# Patient Record
Sex: Male | Born: 1950 | Race: Black or African American | Hispanic: No | Marital: Married | Smoking: Former smoker
Health system: Southern US, Community
[De-identification: ages and names within clinical notes are randomized; demographics above are authoritative.]

## PROBLEM LIST (undated history)

## (undated) DIAGNOSIS — E785 Hyperlipidemia, unspecified: Secondary | ICD-10-CM

## (undated) DIAGNOSIS — E119 Type 2 diabetes mellitus without complications: Secondary | ICD-10-CM

## (undated) DIAGNOSIS — M542 Cervicalgia: Secondary | ICD-10-CM

## (undated) DIAGNOSIS — G44209 Tension-type headache, unspecified, not intractable: Secondary | ICD-10-CM

## (undated) DIAGNOSIS — N189 Chronic kidney disease, unspecified: Secondary | ICD-10-CM

## (undated) DIAGNOSIS — B009 Herpesviral infection, unspecified: Secondary | ICD-10-CM

## (undated) HISTORY — DX: Hyperlipidemia, unspecified: E78.5

## (undated) HISTORY — PX: OTHER SURGICAL HISTORY: SHX169

## (undated) HISTORY — DX: Cervicalgia: M54.2

## (undated) HISTORY — DX: Chronic kidney disease, unspecified: N18.9

## (undated) HISTORY — DX: Type 2 diabetes mellitus without complications: E11.9

## (undated) HISTORY — DX: Herpesviral infection, unspecified: B00.9

## (undated) HISTORY — DX: Tension-type headache, unspecified, not intractable: G44.209

---
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
## 2016-02-08 DIAGNOSIS — L908 Other atrophic disorders of skin: Secondary | ICD-10-CM | POA: Diagnosis not present

## 2016-02-08 DIAGNOSIS — B078 Other viral warts: Secondary | ICD-10-CM | POA: Diagnosis not present

## 2016-02-08 DIAGNOSIS — M150 Primary generalized (osteo)arthritis: Secondary | ICD-10-CM | POA: Diagnosis not present

## 2016-02-09 DIAGNOSIS — N529 Male erectile dysfunction, unspecified: Secondary | ICD-10-CM | POA: Diagnosis not present

## 2016-02-10 DIAGNOSIS — R51 Headache: Secondary | ICD-10-CM | POA: Diagnosis not present

## 2016-02-10 DIAGNOSIS — M542 Cervicalgia: Secondary | ICD-10-CM | POA: Diagnosis not present

## 2016-02-12 DIAGNOSIS — H04123 Dry eye syndrome of bilateral lacrimal glands: Secondary | ICD-10-CM | POA: Diagnosis not present

## 2016-02-12 DIAGNOSIS — E113293 Type 2 diabetes mellitus with mild nonproliferative diabetic retinopathy without macular edema, bilateral: Secondary | ICD-10-CM | POA: Diagnosis not present

## 2016-02-16 DIAGNOSIS — L309 Dermatitis, unspecified: Secondary | ICD-10-CM | POA: Diagnosis not present

## 2016-02-20 DIAGNOSIS — B353 Tinea pedis: Secondary | ICD-10-CM | POA: Diagnosis not present

## 2016-02-20 DIAGNOSIS — B078 Other viral warts: Secondary | ICD-10-CM | POA: Diagnosis not present

## 2016-02-20 DIAGNOSIS — M150 Primary generalized (osteo)arthritis: Secondary | ICD-10-CM | POA: Diagnosis not present

## 2016-03-28 DIAGNOSIS — J309 Allergic rhinitis, unspecified: Secondary | ICD-10-CM | POA: Diagnosis not present

## 2016-03-28 DIAGNOSIS — E119 Type 2 diabetes mellitus without complications: Secondary | ICD-10-CM | POA: Diagnosis not present

## 2016-03-30 DIAGNOSIS — B351 Tinea unguium: Secondary | ICD-10-CM | POA: Diagnosis not present

## 2016-03-30 DIAGNOSIS — M2041 Other hammer toe(s) (acquired), right foot: Secondary | ICD-10-CM | POA: Diagnosis not present

## 2016-03-30 DIAGNOSIS — M79675 Pain in left toe(s): Secondary | ICD-10-CM | POA: Diagnosis not present

## 2016-03-30 DIAGNOSIS — L908 Other atrophic disorders of skin: Secondary | ICD-10-CM | POA: Diagnosis not present

## 2016-03-30 DIAGNOSIS — M79674 Pain in right toe(s): Secondary | ICD-10-CM | POA: Diagnosis not present

## 2016-03-30 DIAGNOSIS — M2042 Other hammer toe(s) (acquired), left foot: Secondary | ICD-10-CM | POA: Diagnosis not present

## 2016-04-18 DIAGNOSIS — Z800 Family history of malignant neoplasm of digestive organs: Secondary | ICD-10-CM | POA: Diagnosis not present

## 2016-04-18 DIAGNOSIS — R634 Abnormal weight loss: Secondary | ICD-10-CM | POA: Diagnosis not present

## 2016-04-18 DIAGNOSIS — K30 Functional dyspepsia: Secondary | ICD-10-CM | POA: Diagnosis not present

## 2016-04-18 DIAGNOSIS — Z1211 Encounter for screening for malignant neoplasm of colon: Secondary | ICD-10-CM | POA: Diagnosis not present

## 2016-04-27 DIAGNOSIS — B078 Other viral warts: Secondary | ICD-10-CM | POA: Diagnosis not present

## 2016-04-27 DIAGNOSIS — L908 Other atrophic disorders of skin: Secondary | ICD-10-CM | POA: Diagnosis not present

## 2016-04-27 DIAGNOSIS — M2042 Other hammer toe(s) (acquired), left foot: Secondary | ICD-10-CM | POA: Diagnosis not present

## 2016-04-27 DIAGNOSIS — M2041 Other hammer toe(s) (acquired), right foot: Secondary | ICD-10-CM | POA: Diagnosis not present

## 2016-04-27 DIAGNOSIS — M150 Primary generalized (osteo)arthritis: Secondary | ICD-10-CM | POA: Diagnosis not present

## 2016-05-03 DIAGNOSIS — K2950 Unspecified chronic gastritis without bleeding: Secondary | ICD-10-CM | POA: Diagnosis not present

## 2016-05-03 DIAGNOSIS — D126 Benign neoplasm of colon, unspecified: Secondary | ICD-10-CM | POA: Diagnosis not present

## 2016-05-03 DIAGNOSIS — K2270 Barrett's esophagus without dysplasia: Secondary | ICD-10-CM | POA: Diagnosis not present

## 2016-05-03 DIAGNOSIS — Z1211 Encounter for screening for malignant neoplasm of colon: Secondary | ICD-10-CM | POA: Diagnosis not present

## 2016-05-03 DIAGNOSIS — K2900 Acute gastritis without bleeding: Secondary | ICD-10-CM | POA: Diagnosis not present

## 2016-05-03 DIAGNOSIS — R634 Abnormal weight loss: Secondary | ICD-10-CM | POA: Diagnosis not present

## 2016-05-03 DIAGNOSIS — K529 Noninfective gastroenteritis and colitis, unspecified: Secondary | ICD-10-CM | POA: Diagnosis not present

## 2016-05-09 DIAGNOSIS — K760 Fatty (change of) liver, not elsewhere classified: Secondary | ICD-10-CM | POA: Diagnosis not present

## 2016-05-09 DIAGNOSIS — I700 Atherosclerosis of aorta: Secondary | ICD-10-CM | POA: Diagnosis not present

## 2016-05-09 DIAGNOSIS — K2950 Unspecified chronic gastritis without bleeding: Secondary | ICD-10-CM | POA: Diagnosis not present

## 2016-05-09 DIAGNOSIS — R1032 Left lower quadrant pain: Secondary | ICD-10-CM | POA: Diagnosis not present

## 2016-05-09 DIAGNOSIS — D126 Benign neoplasm of colon, unspecified: Secondary | ICD-10-CM | POA: Diagnosis not present

## 2016-05-09 DIAGNOSIS — K529 Noninfective gastroenteritis and colitis, unspecified: Secondary | ICD-10-CM | POA: Diagnosis not present

## 2016-05-09 DIAGNOSIS — K2270 Barrett's esophagus without dysplasia: Secondary | ICD-10-CM | POA: Diagnosis not present

## 2016-05-09 DIAGNOSIS — N401 Enlarged prostate with lower urinary tract symptoms: Secondary | ICD-10-CM | POA: Diagnosis not present

## 2016-05-10 DIAGNOSIS — E119 Type 2 diabetes mellitus without complications: Secondary | ICD-10-CM | POA: Diagnosis not present

## 2016-05-10 DIAGNOSIS — T508X5A Adverse effect of diagnostic agents, initial encounter: Secondary | ICD-10-CM | POA: Diagnosis not present

## 2016-05-10 DIAGNOSIS — L299 Pruritus, unspecified: Secondary | ICD-10-CM | POA: Diagnosis not present

## 2016-05-10 DIAGNOSIS — T7840XA Allergy, unspecified, initial encounter: Secondary | ICD-10-CM | POA: Diagnosis not present

## 2016-05-10 DIAGNOSIS — R220 Localized swelling, mass and lump, head: Secondary | ICD-10-CM | POA: Diagnosis not present

## 2016-05-10 DIAGNOSIS — E039 Hypothyroidism, unspecified: Secondary | ICD-10-CM | POA: Diagnosis not present

## 2016-05-10 DIAGNOSIS — Z791 Long term (current) use of non-steroidal anti-inflammatories (NSAID): Secondary | ICD-10-CM | POA: Diagnosis not present

## 2016-05-13 DIAGNOSIS — R05 Cough: Secondary | ICD-10-CM | POA: Diagnosis not present

## 2016-05-23 DIAGNOSIS — M79674 Pain in right toe(s): Secondary | ICD-10-CM | POA: Diagnosis not present

## 2016-05-23 DIAGNOSIS — M79675 Pain in left toe(s): Secondary | ICD-10-CM | POA: Diagnosis not present

## 2016-05-23 DIAGNOSIS — B078 Other viral warts: Secondary | ICD-10-CM | POA: Diagnosis not present

## 2016-05-23 DIAGNOSIS — L908 Other atrophic disorders of skin: Secondary | ICD-10-CM | POA: Diagnosis not present

## 2016-05-23 DIAGNOSIS — B353 Tinea pedis: Secondary | ICD-10-CM | POA: Diagnosis not present

## 2016-06-06 DIAGNOSIS — K2900 Acute gastritis without bleeding: Secondary | ICD-10-CM | POA: Diagnosis not present

## 2016-06-09 DIAGNOSIS — K130 Diseases of lips: Secondary | ICD-10-CM | POA: Diagnosis not present

## 2016-06-09 DIAGNOSIS — J309 Allergic rhinitis, unspecified: Secondary | ICD-10-CM | POA: Diagnosis not present

## 2016-06-09 DIAGNOSIS — R630 Anorexia: Secondary | ICD-10-CM | POA: Diagnosis not present

## 2016-06-09 DIAGNOSIS — E119 Type 2 diabetes mellitus without complications: Secondary | ICD-10-CM | POA: Diagnosis not present

## 2016-06-09 DIAGNOSIS — N401 Enlarged prostate with lower urinary tract symptoms: Secondary | ICD-10-CM | POA: Diagnosis not present

## 2016-06-09 DIAGNOSIS — E559 Vitamin D deficiency, unspecified: Secondary | ICD-10-CM | POA: Diagnosis not present

## 2016-06-09 DIAGNOSIS — B9681 Helicobacter pylori [H. pylori] as the cause of diseases classified elsewhere: Secondary | ICD-10-CM | POA: Diagnosis not present

## 2016-06-09 DIAGNOSIS — E782 Mixed hyperlipidemia: Secondary | ICD-10-CM | POA: Diagnosis not present

## 2016-06-09 DIAGNOSIS — E039 Hypothyroidism, unspecified: Secondary | ICD-10-CM | POA: Diagnosis not present

## 2016-06-20 DIAGNOSIS — M71572 Other bursitis, not elsewhere classified, left ankle and foot: Secondary | ICD-10-CM | POA: Diagnosis not present

## 2016-06-20 DIAGNOSIS — M2041 Other hammer toe(s) (acquired), right foot: Secondary | ICD-10-CM | POA: Diagnosis not present

## 2016-06-20 DIAGNOSIS — B078 Other viral warts: Secondary | ICD-10-CM | POA: Diagnosis not present

## 2016-06-28 DIAGNOSIS — B9681 Helicobacter pylori [H. pylori] as the cause of diseases classified elsewhere: Secondary | ICD-10-CM | POA: Diagnosis not present

## 2016-06-28 DIAGNOSIS — K2900 Acute gastritis without bleeding: Secondary | ICD-10-CM | POA: Diagnosis not present

## 2016-07-18 DIAGNOSIS — M79674 Pain in right toe(s): Secondary | ICD-10-CM | POA: Diagnosis not present

## 2016-07-18 DIAGNOSIS — M71572 Other bursitis, not elsewhere classified, left ankle and foot: Secondary | ICD-10-CM | POA: Diagnosis not present

## 2016-07-18 DIAGNOSIS — M79675 Pain in left toe(s): Secondary | ICD-10-CM | POA: Diagnosis not present

## 2016-07-18 DIAGNOSIS — M2041 Other hammer toe(s) (acquired), right foot: Secondary | ICD-10-CM | POA: Diagnosis not present

## 2016-07-18 DIAGNOSIS — B351 Tinea unguium: Secondary | ICD-10-CM | POA: Diagnosis not present

## 2016-07-18 DIAGNOSIS — M2042 Other hammer toe(s) (acquired), left foot: Secondary | ICD-10-CM | POA: Diagnosis not present

## 2016-08-17 DIAGNOSIS — M71572 Other bursitis, not elsewhere classified, left ankle and foot: Secondary | ICD-10-CM | POA: Diagnosis not present

## 2016-08-17 DIAGNOSIS — L908 Other atrophic disorders of skin: Secondary | ICD-10-CM | POA: Diagnosis not present

## 2016-08-17 DIAGNOSIS — B078 Other viral warts: Secondary | ICD-10-CM | POA: Diagnosis not present

## 2016-09-05 DIAGNOSIS — R0600 Dyspnea, unspecified: Secondary | ICD-10-CM | POA: Diagnosis not present

## 2016-09-05 DIAGNOSIS — E782 Mixed hyperlipidemia: Secondary | ICD-10-CM | POA: Diagnosis not present

## 2016-09-05 DIAGNOSIS — R972 Elevated prostate specific antigen [PSA]: Secondary | ICD-10-CM | POA: Diagnosis not present

## 2016-09-05 DIAGNOSIS — Z0000 Encounter for general adult medical examination without abnormal findings: Secondary | ICD-10-CM | POA: Diagnosis not present

## 2016-09-05 DIAGNOSIS — M79606 Pain in leg, unspecified: Secondary | ICD-10-CM | POA: Diagnosis not present

## 2016-09-05 DIAGNOSIS — E119 Type 2 diabetes mellitus without complications: Secondary | ICD-10-CM | POA: Diagnosis not present

## 2016-09-05 DIAGNOSIS — L608 Other nail disorders: Secondary | ICD-10-CM | POA: Diagnosis not present

## 2016-09-05 DIAGNOSIS — H9319 Tinnitus, unspecified ear: Secondary | ICD-10-CM | POA: Diagnosis not present

## 2016-09-05 DIAGNOSIS — R630 Anorexia: Secondary | ICD-10-CM | POA: Diagnosis not present

## 2016-09-05 DIAGNOSIS — E039 Hypothyroidism, unspecified: Secondary | ICD-10-CM | POA: Diagnosis not present

## 2016-09-05 DIAGNOSIS — Z1389 Encounter for screening for other disorder: Secondary | ICD-10-CM | POA: Diagnosis not present

## 2016-09-05 DIAGNOSIS — B9681 Helicobacter pylori [H. pylori] as the cause of diseases classified elsewhere: Secondary | ICD-10-CM | POA: Diagnosis not present

## 2016-09-05 DIAGNOSIS — E559 Vitamin D deficiency, unspecified: Secondary | ICD-10-CM | POA: Diagnosis not present

## 2016-09-05 DIAGNOSIS — J309 Allergic rhinitis, unspecified: Secondary | ICD-10-CM | POA: Diagnosis not present

## 2016-09-05 DIAGNOSIS — I519 Heart disease, unspecified: Secondary | ICD-10-CM | POA: Diagnosis not present

## 2016-09-05 DIAGNOSIS — R7309 Other abnormal glucose: Secondary | ICD-10-CM | POA: Diagnosis not present

## 2016-09-09 DIAGNOSIS — I349 Nonrheumatic mitral valve disorder, unspecified: Secondary | ICD-10-CM | POA: Diagnosis not present

## 2016-09-09 DIAGNOSIS — R0989 Other specified symptoms and signs involving the circulatory and respiratory systems: Secondary | ICD-10-CM | POA: Diagnosis not present

## 2016-09-09 DIAGNOSIS — I700 Atherosclerosis of aorta: Secondary | ICD-10-CM | POA: Diagnosis not present

## 2016-09-19 DIAGNOSIS — J019 Acute sinusitis, unspecified: Secondary | ICD-10-CM | POA: Diagnosis not present

## 2016-12-19 DIAGNOSIS — M150 Primary generalized (osteo)arthritis: Secondary | ICD-10-CM | POA: Diagnosis not present

## 2016-12-19 DIAGNOSIS — L908 Other atrophic disorders of skin: Secondary | ICD-10-CM | POA: Diagnosis not present

## 2016-12-19 DIAGNOSIS — B078 Other viral warts: Secondary | ICD-10-CM | POA: Diagnosis not present

## 2016-12-19 DIAGNOSIS — B353 Tinea pedis: Secondary | ICD-10-CM | POA: Diagnosis not present

## 2017-01-16 DIAGNOSIS — M79675 Pain in left toe(s): Secondary | ICD-10-CM | POA: Diagnosis not present

## 2017-01-16 DIAGNOSIS — B353 Tinea pedis: Secondary | ICD-10-CM | POA: Diagnosis not present

## 2017-01-16 DIAGNOSIS — B351 Tinea unguium: Secondary | ICD-10-CM | POA: Diagnosis not present

## 2017-01-16 DIAGNOSIS — M79674 Pain in right toe(s): Secondary | ICD-10-CM | POA: Diagnosis not present

## 2017-01-16 DIAGNOSIS — M150 Primary generalized (osteo)arthritis: Secondary | ICD-10-CM | POA: Diagnosis not present

## 2017-02-18 DIAGNOSIS — B078 Other viral warts: Secondary | ICD-10-CM | POA: Diagnosis not present

## 2017-02-18 DIAGNOSIS — M2011 Hallux valgus (acquired), right foot: Secondary | ICD-10-CM | POA: Diagnosis not present

## 2017-02-18 DIAGNOSIS — M2012 Hallux valgus (acquired), left foot: Secondary | ICD-10-CM | POA: Diagnosis not present

## 2017-02-18 DIAGNOSIS — L908 Other atrophic disorders of skin: Secondary | ICD-10-CM | POA: Diagnosis not present

## 2017-03-15 DIAGNOSIS — B009 Herpesviral infection, unspecified: Secondary | ICD-10-CM | POA: Insufficient documentation

## 2017-03-15 DIAGNOSIS — E119 Type 2 diabetes mellitus without complications: Secondary | ICD-10-CM | POA: Diagnosis not present

## 2017-03-15 DIAGNOSIS — R05 Cough: Secondary | ICD-10-CM | POA: Diagnosis not present

## 2017-03-15 DIAGNOSIS — R070 Pain in throat: Secondary | ICD-10-CM | POA: Diagnosis not present

## 2017-03-15 DIAGNOSIS — R0981 Nasal congestion: Secondary | ICD-10-CM | POA: Diagnosis not present

## 2017-03-15 DIAGNOSIS — R52 Pain, unspecified: Secondary | ICD-10-CM | POA: Diagnosis not present

## 2017-03-29 DIAGNOSIS — M150 Primary generalized (osteo)arthritis: Secondary | ICD-10-CM | POA: Diagnosis not present

## 2017-03-29 DIAGNOSIS — M79674 Pain in right toe(s): Secondary | ICD-10-CM | POA: Diagnosis not present

## 2017-03-29 DIAGNOSIS — M79675 Pain in left toe(s): Secondary | ICD-10-CM | POA: Diagnosis not present

## 2017-03-29 DIAGNOSIS — B351 Tinea unguium: Secondary | ICD-10-CM | POA: Diagnosis not present

## 2017-03-29 DIAGNOSIS — L908 Other atrophic disorders of skin: Secondary | ICD-10-CM | POA: Diagnosis not present

## 2017-04-06 DIAGNOSIS — I519 Heart disease, unspecified: Secondary | ICD-10-CM | POA: Diagnosis not present

## 2017-04-06 DIAGNOSIS — M25462 Effusion, left knee: Secondary | ICD-10-CM | POA: Diagnosis not present

## 2017-04-06 DIAGNOSIS — E559 Vitamin D deficiency, unspecified: Secondary | ICD-10-CM | POA: Diagnosis not present

## 2017-04-06 DIAGNOSIS — E119 Type 2 diabetes mellitus without complications: Secondary | ICD-10-CM | POA: Diagnosis not present

## 2017-04-06 DIAGNOSIS — E782 Mixed hyperlipidemia: Secondary | ICD-10-CM | POA: Diagnosis not present

## 2017-04-06 DIAGNOSIS — E039 Hypothyroidism, unspecified: Secondary | ICD-10-CM | POA: Diagnosis not present

## 2017-04-06 DIAGNOSIS — R6889 Other general symptoms and signs: Secondary | ICD-10-CM | POA: Diagnosis not present

## 2017-04-06 DIAGNOSIS — J309 Allergic rhinitis, unspecified: Secondary | ICD-10-CM | POA: Diagnosis not present

## 2017-04-06 DIAGNOSIS — I10 Essential (primary) hypertension: Secondary | ICD-10-CM | POA: Diagnosis not present

## 2017-04-06 DIAGNOSIS — M79606 Pain in leg, unspecified: Secondary | ICD-10-CM | POA: Diagnosis not present

## 2017-04-06 DIAGNOSIS — R630 Anorexia: Secondary | ICD-10-CM | POA: Diagnosis not present

## 2017-04-12 DIAGNOSIS — M1711 Unilateral primary osteoarthritis, right knee: Secondary | ICD-10-CM | POA: Diagnosis not present

## 2017-04-17 DIAGNOSIS — H905 Unspecified sensorineural hearing loss: Secondary | ICD-10-CM | POA: Diagnosis not present

## 2017-04-17 DIAGNOSIS — H902 Conductive hearing loss, unspecified: Secondary | ICD-10-CM | POA: Diagnosis not present

## 2017-04-17 DIAGNOSIS — H6123 Impacted cerumen, bilateral: Secondary | ICD-10-CM | POA: Diagnosis not present

## 2017-05-01 DIAGNOSIS — M2042 Other hammer toe(s) (acquired), left foot: Secondary | ICD-10-CM | POA: Diagnosis not present

## 2017-05-01 DIAGNOSIS — M150 Primary generalized (osteo)arthritis: Secondary | ICD-10-CM | POA: Diagnosis not present

## 2017-05-01 DIAGNOSIS — M2041 Other hammer toe(s) (acquired), right foot: Secondary | ICD-10-CM | POA: Diagnosis not present

## 2017-05-01 DIAGNOSIS — L908 Other atrophic disorders of skin: Secondary | ICD-10-CM | POA: Diagnosis not present

## 2017-06-05 DIAGNOSIS — M79674 Pain in right toe(s): Secondary | ICD-10-CM | POA: Diagnosis not present

## 2017-06-05 DIAGNOSIS — M79675 Pain in left toe(s): Secondary | ICD-10-CM | POA: Diagnosis not present

## 2017-06-05 DIAGNOSIS — M2042 Other hammer toe(s) (acquired), left foot: Secondary | ICD-10-CM | POA: Diagnosis not present

## 2017-06-05 DIAGNOSIS — M2041 Other hammer toe(s) (acquired), right foot: Secondary | ICD-10-CM | POA: Diagnosis not present

## 2017-06-05 DIAGNOSIS — M71572 Other bursitis, not elsewhere classified, left ankle and foot: Secondary | ICD-10-CM | POA: Diagnosis not present

## 2017-06-05 DIAGNOSIS — B351 Tinea unguium: Secondary | ICD-10-CM | POA: Diagnosis not present

## 2017-07-01 DIAGNOSIS — Z8673 Personal history of transient ischemic attack (TIA), and cerebral infarction without residual deficits: Secondary | ICD-10-CM

## 2017-07-01 HISTORY — DX: Personal history of transient ischemic attack (TIA), and cerebral infarction without residual deficits: Z86.73

## 2017-07-03 DIAGNOSIS — M150 Primary generalized (osteo)arthritis: Secondary | ICD-10-CM | POA: Diagnosis not present

## 2017-07-03 DIAGNOSIS — L748 Other eccrine sweat disorders: Secondary | ICD-10-CM | POA: Diagnosis not present

## 2017-07-03 DIAGNOSIS — L908 Other atrophic disorders of skin: Secondary | ICD-10-CM | POA: Diagnosis not present

## 2017-07-17 DIAGNOSIS — I519 Heart disease, unspecified: Secondary | ICD-10-CM | POA: Diagnosis not present

## 2017-07-17 DIAGNOSIS — J309 Allergic rhinitis, unspecified: Secondary | ICD-10-CM | POA: Diagnosis not present

## 2017-07-17 DIAGNOSIS — E119 Type 2 diabetes mellitus without complications: Secondary | ICD-10-CM | POA: Diagnosis not present

## 2017-07-17 DIAGNOSIS — E782 Mixed hyperlipidemia: Secondary | ICD-10-CM | POA: Diagnosis not present

## 2017-07-17 DIAGNOSIS — E039 Hypothyroidism, unspecified: Secondary | ICD-10-CM | POA: Diagnosis not present

## 2017-07-17 DIAGNOSIS — R7309 Other abnormal glucose: Secondary | ICD-10-CM | POA: Diagnosis not present

## 2017-07-17 DIAGNOSIS — R6889 Other general symptoms and signs: Secondary | ICD-10-CM | POA: Diagnosis not present

## 2017-08-14 DIAGNOSIS — B351 Tinea unguium: Secondary | ICD-10-CM | POA: Diagnosis not present

## 2017-08-14 DIAGNOSIS — M79675 Pain in left toe(s): Secondary | ICD-10-CM | POA: Diagnosis not present

## 2017-08-14 DIAGNOSIS — M79674 Pain in right toe(s): Secondary | ICD-10-CM | POA: Diagnosis not present

## 2017-08-14 DIAGNOSIS — M150 Primary generalized (osteo)arthritis: Secondary | ICD-10-CM | POA: Diagnosis not present

## 2017-08-14 DIAGNOSIS — L908 Other atrophic disorders of skin: Secondary | ICD-10-CM | POA: Diagnosis not present

## 2017-09-18 DIAGNOSIS — L748 Other eccrine sweat disorders: Secondary | ICD-10-CM | POA: Diagnosis not present

## 2017-09-18 DIAGNOSIS — L908 Other atrophic disorders of skin: Secondary | ICD-10-CM | POA: Diagnosis not present

## 2017-09-18 DIAGNOSIS — M150 Primary generalized (osteo)arthritis: Secondary | ICD-10-CM | POA: Diagnosis not present

## 2017-10-16 DIAGNOSIS — M71572 Other bursitis, not elsewhere classified, left ankle and foot: Secondary | ICD-10-CM | POA: Diagnosis not present

## 2017-10-16 DIAGNOSIS — M2041 Other hammer toe(s) (acquired), right foot: Secondary | ICD-10-CM | POA: Diagnosis not present

## 2017-10-16 DIAGNOSIS — L749 Eccrine sweat disorder, unspecified: Secondary | ICD-10-CM | POA: Diagnosis not present

## 2017-10-16 DIAGNOSIS — M2042 Other hammer toe(s) (acquired), left foot: Secondary | ICD-10-CM | POA: Diagnosis not present

## 2017-10-25 DIAGNOSIS — E782 Mixed hyperlipidemia: Secondary | ICD-10-CM | POA: Diagnosis not present

## 2017-10-25 DIAGNOSIS — Z23 Encounter for immunization: Secondary | ICD-10-CM | POA: Diagnosis not present

## 2017-10-25 DIAGNOSIS — M25462 Effusion, left knee: Secondary | ICD-10-CM | POA: Diagnosis not present

## 2017-10-25 DIAGNOSIS — E1165 Type 2 diabetes mellitus with hyperglycemia: Secondary | ICD-10-CM | POA: Diagnosis not present

## 2017-10-25 DIAGNOSIS — R6889 Other general symptoms and signs: Secondary | ICD-10-CM | POA: Diagnosis not present

## 2017-10-25 DIAGNOSIS — E039 Hypothyroidism, unspecified: Secondary | ICD-10-CM | POA: Diagnosis not present

## 2017-10-25 DIAGNOSIS — I10 Essential (primary) hypertension: Secondary | ICD-10-CM | POA: Diagnosis not present

## 2017-10-25 DIAGNOSIS — I519 Heart disease, unspecified: Secondary | ICD-10-CM | POA: Diagnosis not present

## 2017-10-25 DIAGNOSIS — E559 Vitamin D deficiency, unspecified: Secondary | ICD-10-CM | POA: Diagnosis not present

## 2017-10-25 DIAGNOSIS — J309 Allergic rhinitis, unspecified: Secondary | ICD-10-CM | POA: Diagnosis not present

## 2017-10-25 DIAGNOSIS — Z0000 Encounter for general adult medical examination without abnormal findings: Secondary | ICD-10-CM | POA: Diagnosis not present

## 2017-10-25 DIAGNOSIS — E119 Type 2 diabetes mellitus without complications: Secondary | ICD-10-CM | POA: Diagnosis not present

## 2017-10-25 DIAGNOSIS — M79606 Pain in leg, unspecified: Secondary | ICD-10-CM | POA: Diagnosis not present

## 2017-11-13 DIAGNOSIS — B351 Tinea unguium: Secondary | ICD-10-CM | POA: Diagnosis not present

## 2017-11-13 DIAGNOSIS — M71572 Other bursitis, not elsewhere classified, left ankle and foot: Secondary | ICD-10-CM | POA: Diagnosis not present

## 2017-11-13 DIAGNOSIS — M79674 Pain in right toe(s): Secondary | ICD-10-CM | POA: Diagnosis not present

## 2017-11-13 DIAGNOSIS — M79675 Pain in left toe(s): Secondary | ICD-10-CM | POA: Diagnosis not present

## 2017-11-13 DIAGNOSIS — M2042 Other hammer toe(s) (acquired), left foot: Secondary | ICD-10-CM | POA: Diagnosis not present

## 2017-11-13 DIAGNOSIS — M2041 Other hammer toe(s) (acquired), right foot: Secondary | ICD-10-CM | POA: Diagnosis not present

## 2017-12-20 DIAGNOSIS — J069 Acute upper respiratory infection, unspecified: Secondary | ICD-10-CM | POA: Diagnosis not present

## 2018-01-30 ENCOUNTER — Encounter: Payer: Self-pay | Admitting: Podiatry

## 2018-01-30 ENCOUNTER — Ambulatory Visit (INDEPENDENT_AMBULATORY_CARE_PROVIDER_SITE_OTHER): Payer: Medicare Other | Admitting: Podiatry

## 2018-01-30 VITALS — BP 140/78

## 2018-01-30 DIAGNOSIS — M2042 Other hammer toe(s) (acquired), left foot: Secondary | ICD-10-CM

## 2018-01-30 DIAGNOSIS — B009 Herpesviral infection, unspecified: Secondary | ICD-10-CM | POA: Insufficient documentation

## 2018-01-30 DIAGNOSIS — M2011 Hallux valgus (acquired), right foot: Secondary | ICD-10-CM | POA: Diagnosis not present

## 2018-01-30 DIAGNOSIS — M2012 Hallux valgus (acquired), left foot: Secondary | ICD-10-CM

## 2018-01-30 DIAGNOSIS — L84 Corns and callosities: Secondary | ICD-10-CM | POA: Diagnosis not present

## 2018-01-30 DIAGNOSIS — M2041 Other hammer toe(s) (acquired), right foot: Secondary | ICD-10-CM

## 2018-01-30 DIAGNOSIS — E119 Type 2 diabetes mellitus without complications: Secondary | ICD-10-CM | POA: Insufficient documentation

## 2018-01-30 NOTE — Patient Instructions (Signed)
Diabetes Mellitus and Foot Care  Foot care is an important part of your health, especially when you have diabetes. Diabetes may cause you to have problems because of poor blood flow (circulation) to your feet and legs, which can cause your skin to:   Become thinner and drier.   Break more easily.   Heal more slowly.   Peel and crack.  You may also have nerve damage (neuropathy) in your legs and feet, causing decreased feeling in them. This means that you may not notice minor injuries to your feet that could lead to more serious problems. Noticing and addressing any potential problems early is the best way to prevent future foot problems.  How to care for your feet  Foot hygiene   Wash your feet daily with warm water and mild soap. Do not use hot water. Then, pat your feet and the areas between your toes until they are completely dry. Do not soak your feet as this can dry your skin.   Trim your toenails straight across. Do not dig under them or around the cuticle. File the edges of your nails with an emery board or nail file.   Apply a moisturizing lotion or petroleum jelly to the skin on your feet and to dry, brittle toenails. Use lotion that does not contain alcohol and is unscented. Do not apply lotion between your toes.  Shoes and socks   Wear clean socks or stockings every day. Make sure they are not too tight. Do not wear knee-high stockings since they may decrease blood flow to your legs.   Wear shoes that fit properly and have enough cushioning. Always look in your shoes before you put them on to be sure there are no objects inside.   To break in new shoes, wear them for just a few hours a day. This prevents injuries on your feet.  Wounds, scrapes, corns, and calluses   Check your feet daily for blisters, cuts, bruises, sores, and redness. If you cannot see the bottom of your feet, use a mirror or ask someone for help.   Do not cut corns or calluses or try to remove them with medicine.   If you  find a minor scrape, cut, or break in the skin on your feet, keep it and the skin around it clean and dry. You may clean these areas with mild soap and water. Do not clean the area with peroxide, alcohol, or iodine.   If you have a wound, scrape, corn, or callus on your foot, look at it several times a day to make sure it is healing and not infected. Check for:  ? Redness, swelling, or pain.  ? Fluid or blood.  ? Warmth.  ? Pus or a bad smell.  General instructions   Do not cross your legs. This may decrease blood flow to your feet.   Do not use heating pads or hot water bottles on your feet. They may burn your skin. If you have lost feeling in your feet or legs, you may not know this is happening until it is too late.   Protect your feet from hot and cold by wearing shoes, such as at the beach or on hot pavement.   Schedule a complete foot exam at least once a year (annually) or more often if you have foot problems. If you have foot problems, report any cuts, sores, or bruises to your health care provider immediately.  Contact a health care provider if:     You have a medical condition that increases your risk of infection and you have any cuts, sores, or bruises on your feet.   You have an injury that is not healing.   You have redness on your legs or feet.   You feel burning or tingling in your legs or feet.   You have pain or cramps in your legs and feet.   Your legs or feet are numb.   Your feet always feel cold.   You have pain around a toenail.  Get help right away if:   You have a wound, scrape, corn, or callus on your foot and:  ? You have pain, swelling, or redness that gets worse.  ? You have fluid or blood coming from the wound, scrape, corn, or callus.  ? Your wound, scrape, corn, or callus feels warm to the touch.  ? You have pus or a bad smell coming from the wound, scrape, corn, or callus.  ? You have a fever.  ? You have a red line going up your leg.  Summary   Check your feet every day  for cuts, sores, red spots, swelling, and blisters.   Moisturize feet and legs daily.   Wear shoes that fit properly and have enough cushioning.   If you have foot problems, report any cuts, sores, or bruises to your health care provider immediately.   Schedule a complete foot exam at least once a year (annually) or more often if you have foot problems.  This information is not intended to replace advice given to you by your health care provider. Make sure you discuss any questions you have with your health care provider.  Document Released: 01/15/2000 Document Revised: 03/01/2017 Document Reviewed: 02/19/2016  Elsevier Interactive Patient Education  2019 [employer] Inc[employer]

## 2018-02-25 NOTE — Progress Notes (Signed)
Subjective: Richard Shaw presents to clinic today to establish diabetic foot care.  He relocated to [place] from [place] with his family.  He has not established care with the primary care provider as of today.    Patient is seen today with chief complaint of calluses on the plantar aspect of both feet.  He states he has had podiatric care in the past in [place] in [address].    No past medical history on file.  Patient Active Problem List   Diagnosis Date Noted  . DM (diabetes mellitus) ([facility]) 01/30/2018  . Herpes 01/30/2018      Current Outpatient Medications:  .  famciclovir (FAMVIR) 125 MG tablet, Take 125 mg by mouth 2 (two) times daily., Disp: , Rfl:  .  glipiZIDE (GLUCOTROL) 10 MG tablet, Take 10 mg by mouth daily before breakfast., Disp: , Rfl:  .  metFORMIN (GLUCOPHAGE) 500 MG tablet, Take 100 mg by mouth 2 (two) times daily with a meal. , Disp: , Rfl:   Allergies  Allergen Reactions  . Penicillins Other (See Comments)    hallucinations    Social History   Occupational History  . Not on file  Tobacco Use  . Smoking status: Former Research scientist (life sciences)  . Smokeless tobacco: Former Network engineer and Sexual Activity  . Alcohol use: Not on file  . Drug use: Not on file  . Sexual activity: Not on file    No family history on file.   There is no immunization history on file for this patient.   Review of systems: Positive Findings in bold print.  Constitutional:  chills, fatigue, fever, sweats, weight change Communication: Optometrist, sign Ecologist, hand writing, iPad/Android device Head: headaches, head injury Eyes: changes in vision, eye pain, glaucoma, cataracts, macular degeneration, diplopia, glare,  light sensitivity, eyeglasses or contacts, blindness Ears nose mouth throat: Hard of hearing, ringing in ears, deaf, sign language,  vertigo,   nosebleeds,  rhinitis,  cold sores, snoring, swollen glands Cardiovascular: HTN, edema, arrhythmia, pacemaker  in place, defibrillator in place,  chest pain/tightness, chronic anticoagulation, blood clot, heart failure Peripheral Vascular: leg cramps, varicose veins, blood clots, lymphedema Respiratory:  difficulty breathing, denies congestion, SOB, wheezing, cough, emphysema Gastrointestinal: change in appetite or weight, abdominal pain, constipation, diarrhea, nausea, vomiting, vomiting blood, change in bowel habits, abdominal pain, jaundice, rectal bleeding, hemorrhoids, Genitourinary:  nocturia,  pain on urination,  blood in urine, Foley catheter, urinary urgency Musculoskeletal: uses mobility aid,  cramping, stiff joints, painful joints, decreased joint motion, fractures, OA, gout Skin: +changes in toenails, color change, dryness, itching, mole changes,  rash  Neurological: headaches, numbness in feet, paresthesias in feet, burning in feet, fainting,  seizures, change in speech. denies headaches, memory problems/poor historian, cerebral palsy, weakness, paralysis Endocrine: diabetes, hypothyroidism, hyperthyroidism,  goiter, dry mouth, flushing, heat intolerance,  cold intolerance,  excessive thirst, denies polyuria,  nocturia Hematological:  easy bleeding, excessive bleeding, easy bruising, enlarged lymph nodes, on long term blood thinner, history of past transusions Allergy/immunological:  hives, eczema, frequent infections, multiple drug allergies, seasonal allergies, transplant recipient Psychiatric:  anxiety, depression, mood disorder, suicidal ideations, hallucinations   Objective: Vascular Examination: Capillary refill time immediate x 10 digits Dorsalis pedis and posterior tibial pulses present b/l No digital hair x 10 digits Skin temperature gradient WNL b/l  Dermatological Examination: Skin with normal turgor, texture and tone b/l  Toenails 1-5 b/l are well-maintained with adequate length.  Hyperkeratotic lesions noted submetatarsal head 3 left foot, submetatarsal head  1 right foot,  and submetatarsal head 5 right.  Musculoskeletal: Muscle strength 5/5 to all LE muscle groups  Hammertoes noted digits 2 through 5 bilaterally Rigid hammertoe deformity noted left second digit. Hallux abductovalgus with bunion deformity bilaterally.  Neurological: Sensation intact with 10 gram monofilament Vibratory sensation intact.  Assessment: 1. Painful calluses submetatarsal head 3 left foot submetatarsal head 1 right foot and submetatarsal head 5 right foot 2. NIDDM 3. Hallux abductovalgus with bunion deformity bilaterally 4. Hammertoe deformity digits 2 through 5 bilaterally  Plan: 1. Discussed diabetic foot care principles. Literature dispensed on today. 2. We did discuss the diabetic shoe program with the patient.  However he will have to wait until he establishes care with a primary care physician who will be treating his diabetes. 3. Patient to continue soft, supportive shoe gear 4. Patient to report any pedal injuries to medical professional immediately. 5. Follow up 3 months. Patient/POA to call should there be a concern in the interim.

## 2018-02-28 DIAGNOSIS — E1169 Type 2 diabetes mellitus with other specified complication: Secondary | ICD-10-CM | POA: Diagnosis not present

## 2018-02-28 DIAGNOSIS — B001 Herpesviral vesicular dermatitis: Secondary | ICD-10-CM | POA: Diagnosis not present

## 2018-02-28 DIAGNOSIS — D229 Melanocytic nevi, unspecified: Secondary | ICD-10-CM | POA: Diagnosis not present

## 2018-03-28 DIAGNOSIS — E119 Type 2 diabetes mellitus without complications: Secondary | ICD-10-CM | POA: Diagnosis not present

## 2018-03-28 DIAGNOSIS — Z23 Encounter for immunization: Secondary | ICD-10-CM | POA: Diagnosis not present

## 2018-03-28 DIAGNOSIS — Z125 Encounter for screening for malignant neoplasm of prostate: Secondary | ICD-10-CM | POA: Diagnosis not present

## 2018-03-28 DIAGNOSIS — N189 Chronic kidney disease, unspecified: Secondary | ICD-10-CM | POA: Diagnosis not present

## 2018-03-28 DIAGNOSIS — D229 Melanocytic nevi, unspecified: Secondary | ICD-10-CM | POA: Diagnosis not present

## 2018-03-28 DIAGNOSIS — Z0000 Encounter for general adult medical examination without abnormal findings: Secondary | ICD-10-CM | POA: Diagnosis not present

## 2018-03-28 DIAGNOSIS — Z1389 Encounter for screening for other disorder: Secondary | ICD-10-CM | POA: Diagnosis not present

## 2018-03-28 DIAGNOSIS — B001 Herpesviral vesicular dermatitis: Secondary | ICD-10-CM | POA: Diagnosis not present

## 2018-03-28 DIAGNOSIS — E1169 Type 2 diabetes mellitus with other specified complication: Secondary | ICD-10-CM | POA: Diagnosis not present

## 2018-03-28 DIAGNOSIS — Z1211 Encounter for screening for malignant neoplasm of colon: Secondary | ICD-10-CM | POA: Diagnosis not present

## 2018-04-03 ENCOUNTER — Other Ambulatory Visit: Payer: Self-pay

## 2018-04-03 ENCOUNTER — Ambulatory Visit (INDEPENDENT_AMBULATORY_CARE_PROVIDER_SITE_OTHER): Payer: Medicare Other | Admitting: Podiatry

## 2018-04-03 DIAGNOSIS — M79675 Pain in left toe(s): Secondary | ICD-10-CM | POA: Diagnosis not present

## 2018-04-03 DIAGNOSIS — E119 Type 2 diabetes mellitus without complications: Secondary | ICD-10-CM | POA: Diagnosis not present

## 2018-04-03 DIAGNOSIS — B351 Tinea unguium: Secondary | ICD-10-CM

## 2018-04-03 DIAGNOSIS — M79674 Pain in right toe(s): Secondary | ICD-10-CM | POA: Diagnosis not present

## 2018-04-03 DIAGNOSIS — L84 Corns and callosities: Secondary | ICD-10-CM | POA: Diagnosis not present

## 2018-04-03 NOTE — Patient Instructions (Addendum)
Corns and Calluses Corns are small areas of thickened skin that occur on the top, sides, or tip of a toe. They contain a cone-shaped core with a point that can press on a nerve below. This causes pain.  Calluses are areas of thickened skin that can occur anywhere on the body, including the hands, fingers, palms, soles of the feet, and heels. Calluses are usually larger than corns. What are the causes? Corns and calluses are caused by rubbing (friction) or pressure, such as from shoes that are too tight or do not fit properly. What increases the risk? Corns are more likely to develop in people who have misshapen toes (toe deformities), such as hammer toes. Calluses can occur with friction to any area of the skin. They are more likely to develop in people who:  Work with their hands.  Wear shoes that fit poorly, are too tight, or are high-heeled.  Have toe deformities. What are the signs or symptoms? Symptoms of a corn or callus include:  A hard growth on the skin.  Pain or tenderness under the skin.  Redness and swelling.  Increased discomfort while wearing tight-fitting shoes, if your feet are affected. If a corn or callus becomes infected, symptoms may include:  Redness and swelling that gets worse.  Pain.  Fluid, blood, or pus draining from the corn or callus. How is this diagnosed? Corns and calluses may be diagnosed based on your symptoms, your medical history, and a physical exam. How is this treated? Treatment for corns and calluses may include:  Removing the cause of the friction or pressure. This may involve: ? Changing your shoes. ? Wearing shoe inserts (orthotics) or other protective layers in your shoes, such as a corn pad. ? Wearing gloves.  Applying medicine to the skin (topical medicine) to help soften skin in the hardened, thickened areas.  Removing layers of dead skin with a file to reduce the size of the corn or callus.  Removing the corn or callus with  a scalpel or laser.  Taking antibiotic medicines, if your corn or callus is infected.  Having surgery, if a toe deformity is the cause. Follow these instructions at home:   Take over-the-counter and prescription medicines only as told by your health care provider.  If you were prescribed an antibiotic, take it as told by your health care provider. Do not stop taking it even if your condition starts to improve.  Wear shoes that fit well. Avoid wearing high-heeled shoes and shoes that are too tight or too loose.  Wear any padding, protective layers, gloves, or orthotics as told by your health care provider.  Soak your hands or feet and then use a file or pumice stone to soften your corn or callus. Do this as told by your health care provider.  Check your corn or callus every day for symptoms of infection. Contact a health care provider if you:  Notice that your symptoms do not improve with treatment.  Have redness or swelling that gets worse.  Notice that your corn or callus becomes painful.  Have fluid, blood, or pus coming from your corn or callus.  Have new symptoms. Summary  Corns are small areas of thickened skin that occur on the top, sides, or tip of a toe.  Calluses are areas of thickened skin that can occur anywhere on the body, including the hands, fingers, palms, and soles of the feet. Calluses are usually larger than corns.  Corns and calluses are caused by  rubbing (friction) or pressure, such as from shoes that are too tight or do not fit properly.  Treatment may include wearing any padding, protective layers, gloves, or orthotics as told by your health care provider. This information is not intended to replace advice given to you by your health care provider. Make sure you discuss any questions you have with your health care provider. Document Released: 10/24/2003 Document Revised: 11/30/2016 Document Reviewed: 11/30/2016 Elsevier Interactive Patient Education   2019 [employer] Inc[employer]  Diabetes Mellitus and Foot Care Foot care is an important part of your health, especially when you have diabetes. Diabetes may cause you to have problems because of poor blood flow (circulation) to your feet and legs, which can cause your skin to:  Become thinner and drier.  Break more easily.  Heal more slowly.  Peel and crack. You may also have nerve damage (neuropathy) in your legs and feet, causing decreased feeling in them. This means that you may not notice minor injuries to your feet that could lead to more serious problems. Noticing and addressing any potential problems early is the best way to prevent future foot problems. How to care for your feet Foot hygiene  Wash your feet daily with warm water and mild soap. Do not use hot water. Then, pat your feet and the areas between your toes until they are completely dry. Do not soak your feet as this can dry your skin.  Trim your toenails straight across. Do not dig under them or around the cuticle. File the edges of your nails with an emery board or nail file.  Apply a moisturizing lotion or petroleum jelly to the skin on your feet and to dry, brittle toenails. Use lotion that does not contain alcohol and is unscented. Do not apply lotion between your toes. Shoes and socks  Wear clean socks or stockings every day. Make sure they are not too tight. Do not wear knee-high stockings since they may decrease blood flow to your legs.  Wear shoes that fit properly and have enough cushioning. Always look in your shoes before you put them on to be sure there are no objects inside.  To break in new shoes, wear them for just a few hours a day. This prevents injuries on your feet. Wounds, scrapes, corns, and calluses  Check your feet daily for blisters, cuts, bruises, sores, and redness. If you cannot see the bottom of your feet, use a mirror or ask someone for help.  Do not cut corns or calluses or try to remove them with  medicine.  If you find a minor scrape, cut, or break in the skin on your feet, keep it and the skin around it clean and dry. You may clean these areas with mild soap and water. Do not clean the area with peroxide, alcohol, or iodine.  If you have a wound, scrape, corn, or callus on your foot, look at it several times a day to make sure it is healing and not infected. Check for: ? Redness, swelling, or pain. ? Fluid or blood. ? Warmth. ? Pus or a bad smell. General instructions  Do not cross your legs. This may decrease blood flow to your feet.  Do not use heating pads or hot water bottles on your feet. They may burn your skin. If you have lost feeling in your feet or legs, you may not know this is happening until it is too late.  Protect your feet from hot and cold by wearing   shoes, such as at the beach or on hot pavement.  Schedule a complete foot exam at least once a year (annually) or more often if you have foot problems. If you have foot problems, report any cuts, sores, or bruises to your health care provider immediately. Contact a health care provider if:  You have a medical condition that increases your risk of infection and you have any cuts, sores, or bruises on your feet.  You have an injury that is not healing.  You have redness on your legs or feet.  You feel burning or tingling in your legs or feet.  You have pain or cramps in your legs and feet.  Your legs or feet are numb.  Your feet always feel cold.  You have pain around a toenail. Get help right away if:  You have a wound, scrape, corn, or callus on your foot and: ? You have pain, swelling, or redness that gets worse. ? You have fluid or blood coming from the wound, scrape, corn, or callus. ? Your wound, scrape, corn, or callus feels warm to the touch. ? You have pus or a bad smell coming from the wound, scrape, corn, or callus. ? You have a fever. ? You have a red line going up your leg. Summary  Check  your feet every day for cuts, sores, red spots, swelling, and blisters.  Moisturize feet and legs daily.  Wear shoes that fit properly and have enough cushioning.  If you have foot problems, report any cuts, sores, or bruises to your health care provider immediately.  Schedule a complete foot exam at least once a year (annually) or more often if you have foot problems. This information is not intended to replace advice given to you by your health care provider. Make sure you discuss any questions you have with your health care provider. Document Released: 01/15/2000 Document Revised: 03/01/2017 Document Reviewed: 02/19/2016 Elsevier Interactive Patient Education  2019 [employer] Inc.  

## 2018-04-11 DIAGNOSIS — H2512 Age-related nuclear cataract, left eye: Secondary | ICD-10-CM | POA: Diagnosis not present

## 2018-04-11 DIAGNOSIS — H5202 Hypermetropia, left eye: Secondary | ICD-10-CM | POA: Diagnosis not present

## 2018-04-11 DIAGNOSIS — E118 Type 2 diabetes mellitus with unspecified complications: Secondary | ICD-10-CM | POA: Diagnosis not present

## 2018-04-11 DIAGNOSIS — H2511 Age-related nuclear cataract, right eye: Secondary | ICD-10-CM | POA: Diagnosis not present

## 2018-04-15 ENCOUNTER — Encounter: Payer: Self-pay | Admitting: Podiatry

## 2018-04-15 NOTE — Progress Notes (Signed)
Subjective: Richard Shaw presents with diabetes and  Calluses which interfere with activities of daily living. Pain is aggravated when wearing enclosed shoe gear. Pain is relieved with periodic professional debridement.  Patient, No Pcp Per    Current Outpatient Medications:  .  atorvastatin (LIPITOR) 10 MG tablet, TK 1 T PO QD, Disp: , Rfl:  .  clotrimazole-betamethasone (LOTRISONE) cream, APP SML AMT EXT AA BID, Disp: , Rfl:  .  desloratadine (CLARINEX) 5 MG tablet, , Disp: , Rfl:  .  EPINEPHrine 0.3 mg/0.3 mL IJ SOAJ injection, , Disp: , Rfl:  .  famciclovir (FAMVIR) 125 MG tablet, Take 125 mg by mouth 2 (two) times daily., Disp: , Rfl:  .  famciclovir (FAMVIR) 500 MG tablet, , Disp: , Rfl:  .  FARXIGA 5 MG TABS tablet, , Disp: , Rfl:  .  glipiZIDE (GLUCOTROL) 10 MG tablet, Take 10 mg by mouth daily before breakfast., Disp: , Rfl:  .  INVOKANA 100 MG TABS tablet, , Disp: , Rfl:  .  JANUVIA 100 MG tablet, , Disp: , Rfl:  .  metFORMIN (GLUCOPHAGE) 1000 MG tablet, , Disp: , Rfl:  .  metFORMIN (GLUCOPHAGE) 500 MG tablet, Take 100 mg by mouth 2 (two) times daily with a meal. , Disp: , Rfl:  .  simvastatin (ZOCOR) 10 MG tablet, , Disp: , Rfl:   Allergies  Allergen Reactions  . Penicillins Other (See Comments)    hallucinations    Vascular Examination: Capillary refill time immediate x 10 digits.  Dorsalis pedis and Posterior tibial pulses present b/l.  No digital hair x 10 digits.  Skin temperature WNL b/l.  Dermatological Examination: Skin with normal turgor, texture and tone b/l.  Toenails 1-5 b/l discolored, thick, dystrophic with subungual debris and pain with palpation to nailbeds due to thickness of nails.  Hyperkeratotic lesions submet head 3 left foot, submet heads 1, 5 right foot, dorsal left 5th digit PIPJ.  Musculoskeletal: Muscle strength 5/5 to all LE muscle groups.  HAV with bunion b/l.  Hammertoes 2-5 b/l.  Neurological: Sensation intact with 10 gram  monofilament b/l.  Vibratory sensation intact b/l.  Assessment: 1. Painful onychomycosis toenails 1-5 b/l 2. Calluses submet head 3 left foot, submet heads 1, 5 right foot, right 5th digit PIPJ 3. NIDDM   Plan: 1. Continue diabetic foot care principles.  2. Toenails 1-5 b/l were debrided in length and girth without iatrogenic bleeding. 4. Calluses submet head 3 left foot, submet heads 1, 5 right foot, right 5th digit PIPJ pared with sterile scalpel blade without incident. 3. Patient to continue soft, supportive shoe gear. 4. Patient to report any pedal injuries to medical professional immediately. 5. Follow up 9 weeks. 6. Patient/POA to call should there be a concern in the interim.

## 2018-04-19 DIAGNOSIS — B301 Conjunctivitis due to adenovirus: Secondary | ICD-10-CM | POA: Diagnosis not present

## 2018-06-05 ENCOUNTER — Ambulatory Visit: Payer: Medicare Other | Admitting: Podiatry

## 2018-06-06 DIAGNOSIS — Z8619 Personal history of other infectious and parasitic diseases: Secondary | ICD-10-CM | POA: Diagnosis not present

## 2018-06-06 DIAGNOSIS — K5901 Slow transit constipation: Secondary | ICD-10-CM | POA: Diagnosis not present

## 2018-06-06 DIAGNOSIS — K439 Ventral hernia without obstruction or gangrene: Secondary | ICD-10-CM | POA: Diagnosis not present

## 2018-06-07 ENCOUNTER — Encounter: Payer: Self-pay | Admitting: Podiatry

## 2018-06-07 ENCOUNTER — Other Ambulatory Visit: Payer: Self-pay

## 2018-06-07 ENCOUNTER — Ambulatory Visit (INDEPENDENT_AMBULATORY_CARE_PROVIDER_SITE_OTHER): Payer: Medicare Other | Admitting: Podiatry

## 2018-06-07 VITALS — Temp 97.9°F

## 2018-06-07 DIAGNOSIS — M79674 Pain in right toe(s): Secondary | ICD-10-CM

## 2018-06-07 DIAGNOSIS — B351 Tinea unguium: Secondary | ICD-10-CM | POA: Diagnosis not present

## 2018-06-07 DIAGNOSIS — E119 Type 2 diabetes mellitus without complications: Secondary | ICD-10-CM

## 2018-06-07 DIAGNOSIS — Q828 Other specified congenital malformations of skin: Secondary | ICD-10-CM

## 2018-06-07 DIAGNOSIS — M79675 Pain in left toe(s): Secondary | ICD-10-CM

## 2018-06-07 NOTE — Progress Notes (Signed)
Complaint:  Visit Type: Patient returns to my office for continued preventative foot care services. Complaint: Patient states" my nails have grown long and thick and become painful to walk and wear shoes" Patient has been diagnosed with DM with no foot complications. The patient presents for preventative foot care services. No changes to ROS.  Patient also has painful callus both feet.  Podiatric Exam: Vascular: dorsalis pedis and posterior tibial pulses are palpable bilateral. Capillary return is immediate. Temperature gradient is WNL. Skin turgor WNL  Sensorium: Normal Semmes Weinstein monofilament test. Normal tactile sensation bilaterally. Nail Exam: Pt has thick disfigured discolored nails with subungual debris noted bilateral entire nail hallux through fifth toenails Ulcer Exam: There is no evidence of ulcer or pre-ulcerative changes or infection. Orthopedic Exam: Muscle tone and strength are WNL. No limitations in general ROM. No crepitus or effusions noted. Foot type and digits show no abnormalities. HAV  B/L  Hammer toe  B/L. Skin: Porokeratosis sub 3 left and sub 1,5 right foot. No infection or ulcers  Diagnosis:  Onychomycosis, , Pain in right toe, pain in left toes,  Porokeratosis  B/L  Treatment & Plan Procedures and Treatment: Consent by patient was obtained for treatment procedures.   Debridement of mycotic and hypertrophic toenails, 1 through 5 bilateral and clearing of subungual debris. No ulceration, no infection noted. Debride porokeratosis  B/l. Return Visit-Office Procedure: Patient instructed to return to the office for a follow up visit 9 weeks  for continued evaluation and treatment.    Gardiner Barefoot DPM

## 2018-06-29 ENCOUNTER — Other Ambulatory Visit: Payer: Self-pay

## 2018-06-29 ENCOUNTER — Ambulatory Visit (INDEPENDENT_AMBULATORY_CARE_PROVIDER_SITE_OTHER): Payer: Medicare Other | Admitting: Surgery

## 2018-06-29 ENCOUNTER — Encounter: Payer: Self-pay | Admitting: Surgery

## 2018-06-29 VITALS — BP 146/72 | HR 89 | Temp 97.5°F | Ht 69.0 in | Wt 178.0 lb

## 2018-06-29 DIAGNOSIS — K439 Ventral hernia without obstruction or gangrene: Secondary | ICD-10-CM

## 2018-06-29 DIAGNOSIS — M6208 Separation of muscle (nontraumatic), other site: Secondary | ICD-10-CM | POA: Diagnosis not present

## 2018-06-29 NOTE — Progress Notes (Signed)
06/29/2018  Reason for Visit:  Ventral hernia  Referring Provider:  [name], MD  History of Present Illness: Richard Shaw is a 68 y.o. male presenting for evaluation of a possible abdominal hernia.  Patient reports that he has been noticing this about 1 year.  He is noticed bulging in the upper portion of his abdomen in the middle.  He denies having any pain from this area.  Initially did not think much about it but now just wants to make sure that no surgery is needed for this.  He denies any obstructive type of symptoms such as nausea, vomiting, abdominal pain.  He does have a history of constipation.  He reports that the bulging may be worse when he is reclining while having dinner and has noticed that he should eat more upright.  He is uncertain if this bulging is related to any issues with eating as he feels sometimes it food does not go down as easily.  Past Medical History: Past Medical History:  Diagnosis Date  . Diabetes mellitus without complication ([place])   . Hyperlipidemia      Past Surgical History: History reviewed. No pertinent surgical history.  Home Medications: Prior to Admission medications   Medication Sig Start Date End Date Taking? Authorizing Provider  atorvastatin (LIPITOR) 10 MG tablet TK 1 T PO QD 03/02/18  Yes [provider]  clotrimazole-betamethasone (LOTRISONE) cream APP SML AMT EXT AA BID 01/05/18  Yes [provider]  desloratadine (CLARINEX) 5 MG tablet  10/19/17  Yes [provider]  EPINEPHrine 0.3 mg/0.3 mL IJ SOAJ injection  11/01/17  Yes [provider]  famciclovir (FAMVIR) 125 MG tablet Take 125 mg by mouth 2 (two) times daily.   Yes [provider]  famciclovir (FAMVIR) 500 MG tablet  03/28/18  Yes [provider]  FARXIGA 5 MG TABS tablet  03/29/18  Yes [provider]  glipiZIDE (GLUCOTROL) 10 MG tablet Take 10 mg by mouth daily before breakfast.   Yes [provider]  INVOKANA 100 MG TABS tablet  10/06/17  Yes [provider]  JANUVIA 100 MG tablet  03/28/18  Yes [provider]  metFORMIN (GLUCOPHAGE) 1000 MG tablet  03/28/18  Yes [provider]  metFORMIN (GLUCOPHAGE) 500 MG tablet Take 100 mg by mouth 2 (two) times daily with a meal.    Yes [provider]  simvastatin (ZOCOR) 10 MG tablet  03/28/18  Yes [provider]    Allergies: Allergies  Allergen Reactions  . Penicillins Other (See Comments)    hallucinations    Social History:  reports that he has quit smoking. He has quit using smokeless tobacco. No history on file for alcohol and drug.   Family History: Family History  Problem Relation Age of Onset  . Breast cancer Mother   . Stomach cancer Father     Review of Systems: Review of Systems  Constitutional: Negative for chills and fever.  Eyes: Negative for blurred vision.  Respiratory: Negative for shortness of breath.   Cardiovascular: Negative for chest pain.  Gastrointestinal: Negative for abdominal pain, nausea and vomiting.  Genitourinary: Negative for dysuria.  Musculoskeletal: Negative for myalgias.  Skin: Negative for rash.  Neurological: Negative for dizziness.  Psychiatric/Behavioral: Negative for depression.    Physical Exam BP (!) 146/72   Pulse 89   Temp (!) 97.5 F (36.4 C) (Skin)   Ht 5\' 9"  (1.753 m)   Wt 178 lb (80.7 kg)   SpO2  98%   BMI 26.29 kg/m  CONSTITUTIONAL: No acute distress HEENT:  Normocephalic, atraumatic, extraocular motion intact. NECK: Trachea is midline, and there is no jugular venous distension.  RESPIRATORY:  Lungs are clear, and breath sounds are equal bilaterally. Normal respiratory effort without pathologic use of accessory muscles. CARDIOVASCULAR: Heart is regular without murmurs, gallops, or rubs. GI: The abdomen is soft, nondistended, nontender to palpation.  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  Does not appear to have a true fascial defect.  He may potentially have a small umbilical hernia but this is hard to discern.  This is nontender to palpation.  MUSCULOSKELETAL:  Normal muscle strength and tone in all four extremities.  No peripheral edema or cyanosis. SKIN: Skin turgor is normal. There are no pathologic skin lesions.  NEUROLOGIC:  Motor and sensation is grossly normal.  Cranial nerves are grossly intact. PSYCH:  Alert and oriented to person, place and time. Affect is normal.  Laboratory Analysis: No results found for this or any previous visit (from the past 24 hour(s)).  Imaging: No results found.  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  Discussed with the patient that the bulging that he is seeing on his upper abdominal wall is not truly a hernia defect but rather relaxation of the fascia between the rectus muscle.  Since there is no true hernia from this, there is no surgery that is needed to repair it.  I did discuss that he could potentially have an umbilical hernia but this has been otherwise asymptomatic.  I also discussed with the patient that if he wanted to be absolutely sure about the diagnosis, that we could do a CAT scan of his abdomen to further evaluate.  He would like to do this just to be sure.  We will order a CT of his abdomen with contrast.  If there is a umbilical hernia, will discuss further with the patient for potential surgery if he is interested also given that he is not having any symptoms, we may potentially just do watchful waiting.  He would be interested in this if that is the case.  Face-to-face time spent with the patient and care providers was 40 minutes, with more than 50% of the time spent counseling, educating, and coordinating care of the patient.     Melvyn Neth, [facility]

## 2018-06-29 NOTE — Patient Instructions (Addendum)
CT scan outpatient [facility]  07/11/2018 @8 :15 . Nothing to eat  Or drink 4 hours prior. Pick up prekit

## 2018-07-04 ENCOUNTER — Ambulatory Visit
Admission: RE | Admit: 2018-07-04 | Discharge: 2018-07-04 | Disposition: A | Discharge status: Home / Self Care | Payer: Medicare Other | Source: Ambulatory Visit | Attending: Surgery | Admitting: Surgery

## 2018-07-04 ENCOUNTER — Other Ambulatory Visit: Payer: Self-pay

## 2018-07-04 ENCOUNTER — Other Ambulatory Visit: Payer: Self-pay | Admitting: Surgery

## 2018-07-04 DIAGNOSIS — K439 Ventral hernia without obstruction or gangrene: Secondary | ICD-10-CM

## 2018-07-04 DIAGNOSIS — K429 Umbilical hernia without obstruction or gangrene: Secondary | ICD-10-CM | POA: Diagnosis not present

## 2018-07-06 ENCOUNTER — Telehealth: Payer: Self-pay | Admitting: Surgery

## 2018-07-06 NOTE — Telephone Encounter (Signed)
07/06/18  Called patient today to discuss results of his recent CT scan on 6/3.  The upper area of his abdomen does show diastasis recti, but he does have a small umbilical hernia containing fat.  As discussed with him in the office, he confirms again that he does not have any symptoms at the umbilicus.  He would rather do watchful waiting for now.  He understands that he can call us anytime if he starts developing symptoms or if he changes his mind and would like to have his umbilical hernia repaired.  Olean Ree, MD

## 2018-07-11 ENCOUNTER — Ambulatory Visit: Admission: RE | Admit: 2018-07-11 | Payer: Managed Care, Other (non HMO) | Source: Ambulatory Visit

## 2018-08-09 ENCOUNTER — Ambulatory Visit: Payer: Managed Care, Other (non HMO) | Admitting: Podiatry

## 2018-08-15 DIAGNOSIS — L259 Unspecified contact dermatitis, unspecified cause: Secondary | ICD-10-CM | POA: Diagnosis not present

## 2018-08-17 ENCOUNTER — Other Ambulatory Visit: Payer: Self-pay

## 2018-08-17 ENCOUNTER — Encounter: Payer: Self-pay | Admitting: Podiatry

## 2018-08-17 ENCOUNTER — Ambulatory Visit (INDEPENDENT_AMBULATORY_CARE_PROVIDER_SITE_OTHER): Payer: Medicare Other | Admitting: Podiatry

## 2018-08-17 DIAGNOSIS — E119 Type 2 diabetes mellitus without complications: Secondary | ICD-10-CM | POA: Diagnosis not present

## 2018-08-17 DIAGNOSIS — M79675 Pain in left toe(s): Secondary | ICD-10-CM | POA: Diagnosis not present

## 2018-08-17 DIAGNOSIS — B351 Tinea unguium: Secondary | ICD-10-CM

## 2018-08-17 DIAGNOSIS — Q828 Other specified congenital malformations of skin: Secondary | ICD-10-CM | POA: Diagnosis not present

## 2018-08-17 DIAGNOSIS — M79674 Pain in right toe(s): Secondary | ICD-10-CM | POA: Diagnosis not present

## 2018-08-17 NOTE — Patient Instructions (Signed)
Diabetes Mellitus and Foot Care Foot care is an important part of your health, especially when you have diabetes. Diabetes may cause you to have problems because of poor blood flow (circulation) to your feet and legs, which can cause your skin to:  Become thinner and drier.  Break more easily.  Heal more slowly.  Peel and crack. You may also have nerve damage (neuropathy) in your legs and feet, causing decreased feeling in them. This means that you may not notice minor injuries to your feet that could lead to more serious problems. Noticing and addressing any potential problems early is the best way to prevent future foot problems. How to care for your feet Foot hygiene  Wash your feet daily with warm water and mild soap. Do not use hot water. Then, pat your feet and the areas between your toes until they are completely dry. Do not soak your feet as this can dry your skin.  Trim your toenails straight across. Do not dig under them or around the cuticle. File the edges of your nails with an emery board or nail file.  Apply a moisturizing lotion or petroleum jelly to the skin on your feet and to dry, brittle toenails. Use lotion that does not contain alcohol and is unscented. Do not apply lotion between your toes. Shoes and socks  Wear clean socks or stockings every day. Make sure they are not too tight. Do not wear knee-high stockings since they may decrease blood flow to your legs.  Wear shoes that fit properly and have enough cushioning. Always look in your shoes before you put them on to be sure there are no objects inside.  To break in new shoes, wear them for just a few hours a day. This prevents injuries on your feet. Wounds, scrapes, corns, and calluses  Check your feet daily for blisters, cuts, bruises, sores, and redness. If you cannot see the bottom of your feet, use a mirror or ask someone for help.  Do not cut corns or calluses or try to remove them with medicine.  If you  find a minor scrape, cut, or break in the skin on your feet, keep it and the skin around it clean and dry. You may clean these areas with mild soap and water. Do not clean the area with peroxide, alcohol, or iodine.  If you have a wound, scrape, corn, or callus on your foot, look at it several times a day to make sure it is healing and not infected. Check for: ? Redness, swelling, or pain. ? Fluid or blood. ? Warmth. ? Pus or a bad smell. General instructions  Do not cross your legs. This may decrease blood flow to your feet.  Do not use heating pads or hot water bottles on your feet. They may burn your skin. If you have lost feeling in your feet or legs, you may not know this is happening until it is too late.  Protect your feet from hot and cold by wearing shoes, such as at the beach or on hot pavement.  Schedule a complete foot exam at least once a year (annually) or more often if you have foot problems. If you have foot problems, report any cuts, sores, or bruises to your health care provider immediately. Contact a health care provider if:  You have a medical condition that increases your risk of infection and you have any cuts, sores, or bruises on your feet.  You have an injury that is not   healing.  You have redness on your legs or feet.  You feel burning or tingling in your legs or feet.  You have pain or cramps in your legs and feet.  Your legs or feet are numb.  Your feet always feel cold.  You have pain around a toenail. Get help right away if:  You have a wound, scrape, corn, or callus on your foot and: ? You have pain, swelling, or redness that gets worse. ? You have fluid or blood coming from the wound, scrape, corn, or callus. ? Your wound, scrape, corn, or callus feels warm to the touch. ? You have pus or a bad smell coming from the wound, scrape, corn, or callus. ? You have a fever. ? You have a red line going up your leg. Summary  Check your feet every day  for cuts, sores, red spots, swelling, and blisters.  Moisturize feet and legs daily.  Wear shoes that fit properly and have enough cushioning.  If you have foot problems, report any cuts, sores, or bruises to your health care provider immediately.  Schedule a complete foot exam at least once a year (annually) or more often if you have foot problems. This information is not intended to replace advice given to you by your health care provider. Make sure you discuss any questions you have with your health care provider. Document Released: 01/15/2000 Document Revised: 03/01/2017 Document Reviewed: 02/19/2016 Elsevier Patient Education  2020 [employer]   Onychomycosis/Fungal Toenails  WHAT IS IT? An infection that lies within the keratin of your nail plate that is caused by a fungus.  WHY ME? Fungal infections affect all ages, sexes, races, and creeds.  There may be many factors that predispose you to a fungal infection such as age, coexisting medical conditions such as diabetes, or an autoimmune disease; stress, medications, fatigue, genetics, etc.  Bottom line: fungus thrives in a warm, moist environment and your shoes offer such a location.  IS IT CONTAGIOUS? Theoretically, yes.  You do not want to share shoes, nail clippers or files with someone who has fungal toenails.  Walking around barefoot in the same room or sleeping in the same bed is unlikely to transfer the organism.  It is important to realize, however, that fungus can spread easily from one nail to the next on the same foot.  HOW DO WE TREAT THIS?  There are several ways to treat this condition.  Treatment may depend on many factors such as age, medications, pregnancy, liver and kidney conditions, etc.  It is best to ask your doctor which options are available to you.  1. No treatment.   Unlike many other medical concerns, you can live with this condition.  However for many people this can be a painful condition and may lead to  ingrown toenails or a bacterial infection.  It is recommended that you keep the nails cut short to help reduce the amount of fungal nail. 2. Topical treatment.  These range from herbal remedies to prescription strength nail lacquers.  About 40-50% effective, topicals require twice daily application for approximately 9 to 12 months or until an entirely new nail has grown out.  The most effective topicals are medical grade medications available through physicians offices. 3. Oral antifungal medications.  With an 80-90% cure rate, the most common oral medication requires 3 to 4 months of therapy and stays in your system for a year as the new nail grows out.  Oral antifungal medications do require   blood work to make sure it is a safe drug for you.  A liver function panel will be performed prior to starting the medication and after the first month of treatment.  It is important to have the blood work performed to avoid any harmful side effects.  In general, this medication safe but blood work is required. 4. Laser Therapy.  This treatment is performed by applying a specialized laser to the affected nail plate.  This therapy is noninvasive, fast, and non-painful.  It is not covered by insurance and is therefore, out of pocket.  The results have been very good with a 80-95% cure rate.  The [facility] is the only practice in the area to offer this therapy. 5. Permanent Nail Avulsion.  Removing the entire nail so that a new nail will not grow back. 

## 2018-08-22 NOTE — Progress Notes (Signed)
Subjective: Richard Shaw is a 68 y.o. y.o. male who presents today with cc of painful, discolored, thick toenails and calluses which interfere with daily activities. Pain is aggravated when wearing enclosed shoe gear and relieved with periodic professional debridement.  [name], MD is his PCP.    Current Outpatient Medications:  .  atorvastatin (LIPITOR) 10 MG tablet, TK 1 T PO QD, Disp: , Rfl:  .  clotrimazole-betamethasone (LOTRISONE) cream, APP SML AMT EXT AA BID, Disp: , Rfl:  .  desloratadine (CLARINEX) 5 MG tablet, , Disp: , Rfl:  .  EPINEPHrine 0.3 mg/0.3 mL IJ SOAJ injection, , Disp: , Rfl:  .  famciclovir (FAMVIR) 125 MG tablet, Take 125 mg by mouth 2 (two) times daily., Disp: , Rfl:  .  famciclovir (FAMVIR) 500 MG tablet, , Disp: , Rfl:  .  FARXIGA 5 MG TABS tablet, , Disp: , Rfl:  .  glipiZIDE (GLUCOTROL) 10 MG tablet, Take 10 mg by mouth daily before breakfast., Disp: , Rfl:  .  INVOKANA 100 MG TABS tablet, , Disp: , Rfl:  .  JANUVIA 100 MG tablet, , Disp: , Rfl:  .  metFORMIN (GLUCOPHAGE) 1000 MG tablet, , Disp: , Rfl:  .  metFORMIN (GLUCOPHAGE) 500 MG tablet, Take 100 mg by mouth 2 (two) times daily with a meal. , Disp: , Rfl:  .  simvastatin (ZOCOR) 10 MG tablet, , Disp: , Rfl:   Allergies  Allergen Reactions  . Omnipaque [Iohexol] Swelling    Pt states at [facility] in [place] on 05/09/2016 he swelled all over after receiving 27mL of Omnipaque 350 concentration.   [name] Penicillins Other (See Comments)    hallucinations    Objective: Vascular Examination: Capillary refill time immediate x 10 digits.  Dorsalis pedis pulses palpable b/l.  Posterior tibial pulses palpable b/l..  Digital hair sparse x 10 digits.  Skin temperature gradient WNL b/l.  Dermatological Examination: Skin with normal turgor, texture and tone b/l.  Toenails 1-5 b/l discolored, thick, dystrophic with subungual debris and pain with palpation to nailbeds due to thickness  of nails.  Porokeratotic lesions submet heads 3 left foot and submet head 1, 5 right foot with tenderness to palpation. No erythema, no edema, no drainage, no flocculence.  Musculoskeletal: Muscle strength 5/5 to all LE muscle groups.  HAV b/l.  Hammertoes b/l.  Neurological: Sensation intact 5/5 b/l with 10 gram monofilament.  Vibratory sensation intact b/l.  Assessment: 1. Painful onychomycosis toenails 1-5 b/l 2.  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  NIDDM  Plan: 1. Continue diabetic foot care principles. Literature dispensed on today. 2. Toenails 1-5 b/l were debrided in length and girth without iatrogenic bleeding. 3. Porokeratosis submet heads 3 left foot and submet head 1, 5 right foot pared and enucleated with sterile scalpel blade without incident.  4. Patient to continue soft, supportive shoe gear daily. 5. Patient to report any pedal injuries to medical professional immediately. 6. Follow up 9 weeks.  7. Patient/POA to call should there be a concern in the interim.

## 2018-09-25 DIAGNOSIS — E1169 Type 2 diabetes mellitus with other specified complication: Secondary | ICD-10-CM | POA: Diagnosis not present

## 2018-10-31 ENCOUNTER — Ambulatory Visit (INDEPENDENT_AMBULATORY_CARE_PROVIDER_SITE_OTHER): Payer: Medicare Other | Admitting: Podiatry

## 2018-10-31 ENCOUNTER — Other Ambulatory Visit: Payer: Self-pay

## 2018-10-31 DIAGNOSIS — M79671 Pain in right foot: Secondary | ICD-10-CM

## 2018-10-31 DIAGNOSIS — B351 Tinea unguium: Secondary | ICD-10-CM | POA: Diagnosis not present

## 2018-10-31 DIAGNOSIS — M79672 Pain in left foot: Secondary | ICD-10-CM | POA: Diagnosis not present

## 2018-10-31 DIAGNOSIS — E119 Type 2 diabetes mellitus without complications: Secondary | ICD-10-CM

## 2018-10-31 DIAGNOSIS — Q828 Other specified congenital malformations of skin: Secondary | ICD-10-CM

## 2018-10-31 DIAGNOSIS — M79674 Pain in right toe(s): Secondary | ICD-10-CM | POA: Diagnosis not present

## 2018-10-31 DIAGNOSIS — M79675 Pain in left toe(s): Secondary | ICD-10-CM | POA: Diagnosis not present

## 2018-10-31 NOTE — Patient Instructions (Signed)

## 2018-11-01 ENCOUNTER — Encounter: Payer: Self-pay | Admitting: Podiatry

## 2018-11-01 NOTE — Progress Notes (Signed)
Subjective: Richard Shaw is seen today for follow up painful, elongated, thickened toenails 1-5 b/l feet that he cannot cut. Pain interferes with daily activities. Aggravating factor includes wearing enclosed shoe gear and relieved with periodic debridement.  Dr. Charmaine Downs is his PCP.   Current Outpatient Medications on File Prior to Visit  Medication Sig  . atorvastatin (LIPITOR) 10 MG tablet TK 1 T PO QD  . clotrimazole-betamethasone (LOTRISONE) cream APP SML AMT EXT AA BID  . desloratadine (CLARINEX) 5 MG tablet   . EPINEPHrine 0.3 mg/0.3 mL IJ SOAJ injection   . famciclovir (FAMVIR) 125 MG tablet Take 125 mg by mouth 2 (two) times daily.  . famciclovir (FAMVIR) 500 MG tablet   . FARXIGA 5 MG TABS tablet   . glipiZIDE (GLUCOTROL) 10 MG tablet Take 10 mg by mouth daily before breakfast.  . hydrOXYzine (ATARAX/VISTARIL) 25 MG tablet TK 1 T PO Q 8 H FOR 10 DAYS PRN  . INVOKANA 100 MG TABS tablet   . JANUVIA 100 MG tablet   . metFORMIN (GLUCOPHAGE) 1000 MG tablet   . metFORMIN (GLUCOPHAGE) 500 MG tablet Take 100 mg by mouth 2 (two) times daily with a meal.   . simvastatin (ZOCOR) 10 MG tablet   . triamcinolone cream (KENALOG) 0.5 % APP EXT AA BID FOR 1 WK   No current facility-administered medications on file prior to visit.      Allergies  Allergen Reactions  . Omnipaque [Iohexol] Swelling    Pt states at [facility] in [place] on 05/09/2016 he swelled all over after receiving 52mL of Omnipaque 350 concentration.   [name] Penicillins Other (See Comments)    hallucinations   Objective:  Vascular Examination: Capillary refill time immediate x 10 digits.  Dorsalis pedis present b/l.  Posterior tibial pulses present b/l.  Digital hair sparse b/l.   Skin temperature gradient WNL b/l.   Dermatological Examination: Skin with normal turgor, texture and tone b/l.  Toenails 1-5 b/l discolored, thick, dystrophic with subungual debris and pain with palpation to nailbeds due  to thickness of nails.  Porokeratotic lesions submet head 1, 5 right foot and submet head 3 left foot with tenderness to palpation. No erythema, no edema, no drainage, no flocculence.  Musculoskeletal: Muscle strength 5/5 to all LE muscle groups.  HAV with bunion b/l. Hammertoes b/l.  No pain, crepitus or joint limitation noted with ROM.   Neurological Examination: Protective sensation intact  5/5 b/l with 10 gram monofilament.  Assessment: Painful onychomycosis toenails 1-5 b/l  Painful porokeratosis submet head 3 left, submet head 1, 5 right foot NIDDM   Plan: 1. Toenails 1-5 b/l were debrided in length and girth without iatrogenic bleeding. 2. Porokeratosis submet head 3 left, submet head 1, 5 right foot pared and enucleated with sterile scalpel blade without incident. 3. Patient to continue soft, supportive shoe gear 4. Patient to report any pedal injuries to medical professional immediately. 5. Follow up 9 weeks. 6. Patient/POA to call should there be a concern in the interim.

## 2018-12-25 ENCOUNTER — Other Ambulatory Visit: Payer: Self-pay

## 2018-12-25 DIAGNOSIS — Z20828 Contact with and (suspected) exposure to other viral communicable diseases: Secondary | ICD-10-CM | POA: Diagnosis not present

## 2018-12-25 DIAGNOSIS — Z20822 Contact with and (suspected) exposure to covid-19: Secondary | ICD-10-CM

## 2018-12-27 LAB — NOVEL CORONAVIRUS, NAA: SARS-CoV-2, NAA: NOT DETECTED

## 2019-02-05 ENCOUNTER — Ambulatory Visit: Payer: Medicare Other | Admitting: Podiatry

## 2019-02-05 ENCOUNTER — Encounter: Payer: Self-pay | Admitting: Podiatry

## 2019-02-05 ENCOUNTER — Other Ambulatory Visit: Payer: Self-pay

## 2019-02-05 ENCOUNTER — Ambulatory Visit (INDEPENDENT_AMBULATORY_CARE_PROVIDER_SITE_OTHER): Payer: Medicare Other | Admitting: Podiatry

## 2019-02-05 DIAGNOSIS — B351 Tinea unguium: Secondary | ICD-10-CM | POA: Diagnosis not present

## 2019-02-05 DIAGNOSIS — Q828 Other specified congenital malformations of skin: Secondary | ICD-10-CM

## 2019-02-05 DIAGNOSIS — E119 Type 2 diabetes mellitus without complications: Secondary | ICD-10-CM | POA: Diagnosis not present

## 2019-02-05 DIAGNOSIS — M79674 Pain in right toe(s): Secondary | ICD-10-CM | POA: Diagnosis not present

## 2019-02-05 DIAGNOSIS — M79675 Pain in left toe(s): Secondary | ICD-10-CM

## 2019-02-05 NOTE — Patient Instructions (Signed)
Diabetes Mellitus and Foot Care Foot care is an important part of your health, especially when you have diabetes. Diabetes may cause you to have problems because of poor blood flow (circulation) to your feet and legs, which can cause your skin to:  Become thinner and drier.  Break more easily.  Heal more slowly.  Peel and crack. You may also have nerve damage (neuropathy) in your legs and feet, causing decreased feeling in them. This means that you may not notice minor injuries to your feet that could lead to more serious problems. Noticing and addressing any potential problems early is the best way to prevent future foot problems. How to care for your feet Foot hygiene  Wash your feet daily with warm water and mild soap. Do not use hot water. Then, pat your feet and the areas between your toes until they are completely dry. Do not soak your feet as this can dry your skin.  Trim your toenails straight across. Do not dig under them or around the cuticle. File the edges of your nails with an emery board or nail file.  Apply a moisturizing lotion or petroleum jelly to the skin on your feet and to dry, brittle toenails. Use lotion that does not contain alcohol and is unscented. Do not apply lotion between your toes. Shoes and socks  Wear clean socks or stockings every day. Make sure they are not too tight. Do not wear knee-high stockings since they may decrease blood flow to your legs.  Wear shoes that fit properly and have enough cushioning. Always look in your shoes before you put them on to be sure there are no objects inside.  To break in new shoes, wear them for just a few hours a day. This prevents injuries on your feet. Wounds, scrapes, corns, and calluses  Check your feet daily for blisters, cuts, bruises, sores, and redness. If you cannot see the bottom of your feet, use a mirror or ask someone for help.  Do not cut corns or calluses or try to remove them with medicine.  If you  find a minor scrape, cut, or break in the skin on your feet, keep it and the skin around it clean and dry. You may clean these areas with mild soap and water. Do not clean the area with peroxide, alcohol, or iodine.  If you have a wound, scrape, corn, or callus on your foot, look at it several times a day to make sure it is healing and not infected. Check for: ? Redness, swelling, or pain. ? Fluid or blood. ? Warmth. ? Pus or a bad smell. General instructions  Do not cross your legs. This may decrease blood flow to your feet.  Do not use heating pads or hot water bottles on your feet. They may burn your skin. If you have lost feeling in your feet or legs, you may not know this is happening until it is too late.  Protect your feet from hot and cold by wearing shoes, such as at the beach or on hot pavement.  Schedule a complete foot exam at least once a year (annually) or more often if you have foot problems. If you have foot problems, report any cuts, sores, or bruises to your health care provider immediately. Contact a health care provider if:  You have a medical condition that increases your risk of infection and you have any cuts, sores, or bruises on your feet.  You have an injury that is not   healing.  You have redness on your legs or feet.  You feel burning or tingling in your legs or feet.  You have pain or cramps in your legs and feet.  Your legs or feet are numb.  Your feet always feel cold.  You have pain around a toenail. Get help right away if:  You have a wound, scrape, corn, or callus on your foot and: ? You have pain, swelling, or redness that gets worse. ? You have fluid or blood coming from the wound, scrape, corn, or callus. ? Your wound, scrape, corn, or callus feels warm to the touch. ? You have pus or a bad smell coming from the wound, scrape, corn, or callus. ? You have a fever. ? You have a red line going up your leg. Summary  Check your feet every day  for cuts, sores, red spots, swelling, and blisters.  Moisturize feet and legs daily.  Wear shoes that fit properly and have enough cushioning.  If you have foot problems, report any cuts, sores, or bruises to your health care provider immediately.  Schedule a complete foot exam at least once a year (annually) or more often if you have foot problems. This information is not intended to replace advice given to you by your health care provider. Make sure you discuss any questions you have with your health care provider. Document Revised: 10/10/2018 Document Reviewed: 02/19/2016 Elsevier Patient Education  2020 [employer]  

## 2019-02-08 ENCOUNTER — Encounter: Payer: Self-pay | Admitting: Podiatry

## 2019-02-08 NOTE — Progress Notes (Signed)
Subjective: Richard Shaw is a 69 y.o. y.o. male with h/o diabetes who presents today for preventative diabetic foot care. Patient has painful, elongated mycotic toenails b/l feet. Also has plantar porokeratoses which pose a risk and interfere with daily activities. Pain is aggravated when wearing enclosed shoe gear and relieved with periodic professional debridement.  [name], MD is patient's PCP.   Medications reviewed in chart.  Allergies  Allergen Reactions  . Omnipaque [Iohexol] Swelling    Pt states at [facility] in [place] on 05/09/2016 he swelled all over after receiving 32mL of Omnipaque 350 concentration.   [name] Penicillins Other (See Comments)    hallucinations    Objective: There were no vitals filed for this visit.  Vascular Examination: Capillary refill time to digits immediate b/l.  Dorsalis pedis pulses and posterior tibial pulses b/l.  Digital hair sparse b/l.  Skin temperature gradient WNL b/l.  Dermatological Examination: Skin with normal turgor, texture and tone b/l.  Toenails 1-5 b/l discolored, thick, dystrophic with subungual debris and pain with palpation to nailbeds due to thickness of nails.  Porokeratotic lesions submet  head submet head 1, 5 right foot and submet head 3 left foot with tenderness to palpation. No erythema, no edema, no drainage, no flocculence.   Musculoskeletal: Muscle strength 5/5 to all LE muscle groups b/l.  HAV with bunion b/l. Hammertoes b/l.  No pain, crepitus or joint discomfort noted with ROM.  Neurological: Sensation intact 5/5 b/l with 10 gram monofilament.  Assessment: 1. Painful onychomycosis toenails 1-5 b/l 2.   Porokeratoses submet  head submet head 1, 5 right foot and submet head 3 left foot 3.   NIDDM  Plan: 1. Continue diabetic foot care principles. Literature dispensed on today. 2. Toenails 1-5 b/l were debrided in length and girth without iatrogenic bleeding. 3. Porokeratosis submet   head submet head 1, 5 right foot and submet head 3 left foot pared and enucleated with sterile scalpel blade.  Iatrogenic laceration sustained during paring of right foot lesions. Treated with Lumicain Hemostatic Solution and alcohol. Light dressing applied. Patient instructed to apply Neosporin Cream to right foot lesions once daily for one week.  4. Patient to continue soft, supportive shoe gear daily. 5. Patient to report any pedal injuries to medical professional immediately. 6. Follow up 9 weeks.  7. Patient/POA to call should there be a concern in the interim.

## 2019-02-21 DIAGNOSIS — E1169 Type 2 diabetes mellitus with other specified complication: Secondary | ICD-10-CM | POA: Diagnosis not present

## 2019-02-21 DIAGNOSIS — J0130 Acute sphenoidal sinusitis, unspecified: Secondary | ICD-10-CM | POA: Diagnosis not present

## 2019-02-21 DIAGNOSIS — R519 Headache, unspecified: Secondary | ICD-10-CM | POA: Diagnosis not present

## 2019-03-16 DIAGNOSIS — Z23 Encounter for immunization: Secondary | ICD-10-CM | POA: Diagnosis not present

## 2019-03-26 DIAGNOSIS — E119 Type 2 diabetes mellitus without complications: Secondary | ICD-10-CM | POA: Diagnosis not present

## 2019-04-01 DIAGNOSIS — E1169 Type 2 diabetes mellitus with other specified complication: Secondary | ICD-10-CM | POA: Diagnosis not present

## 2019-04-01 DIAGNOSIS — Z0000 Encounter for general adult medical examination without abnormal findings: Secondary | ICD-10-CM | POA: Diagnosis not present

## 2019-04-01 DIAGNOSIS — N189 Chronic kidney disease, unspecified: Secondary | ICD-10-CM | POA: Diagnosis not present

## 2019-04-01 DIAGNOSIS — G44209 Tension-type headache, unspecified, not intractable: Secondary | ICD-10-CM | POA: Diagnosis not present

## 2019-04-01 DIAGNOSIS — M542 Cervicalgia: Secondary | ICD-10-CM | POA: Diagnosis not present

## 2019-04-01 DIAGNOSIS — Q613 Polycystic kidney, unspecified: Secondary | ICD-10-CM | POA: Diagnosis not present

## 2019-04-01 DIAGNOSIS — Z1211 Encounter for screening for malignant neoplasm of colon: Secondary | ICD-10-CM | POA: Diagnosis not present

## 2019-04-01 DIAGNOSIS — Z8673 Personal history of transient ischemic attack (TIA), and cerebral infarction without residual deficits: Secondary | ICD-10-CM | POA: Diagnosis not present

## 2019-04-01 DIAGNOSIS — Z1389 Encounter for screening for other disorder: Secondary | ICD-10-CM | POA: Diagnosis not present

## 2019-04-09 ENCOUNTER — Other Ambulatory Visit: Payer: Self-pay

## 2019-04-09 ENCOUNTER — Encounter: Payer: Self-pay | Admitting: Podiatry

## 2019-04-09 ENCOUNTER — Ambulatory Visit (INDEPENDENT_AMBULATORY_CARE_PROVIDER_SITE_OTHER): Payer: Medicare Other | Admitting: Podiatry

## 2019-04-09 VITALS — Temp 97.1°F

## 2019-04-09 DIAGNOSIS — B351 Tinea unguium: Secondary | ICD-10-CM | POA: Diagnosis not present

## 2019-04-09 DIAGNOSIS — E119 Type 2 diabetes mellitus without complications: Secondary | ICD-10-CM

## 2019-04-09 DIAGNOSIS — Q828 Other specified congenital malformations of skin: Secondary | ICD-10-CM | POA: Diagnosis not present

## 2019-04-09 DIAGNOSIS — M79674 Pain in right toe(s): Secondary | ICD-10-CM | POA: Diagnosis not present

## 2019-04-09 DIAGNOSIS — M79675 Pain in left toe(s): Secondary | ICD-10-CM

## 2019-04-09 NOTE — Patient Instructions (Signed)

## 2019-04-13 DIAGNOSIS — Z23 Encounter for immunization: Secondary | ICD-10-CM | POA: Diagnosis not present

## 2019-04-15 ENCOUNTER — Other Ambulatory Visit: Payer: Self-pay | Admitting: Internal Medicine

## 2019-04-15 ENCOUNTER — Ambulatory Visit
Admission: RE | Admit: 2019-04-15 | Discharge: 2019-04-15 | Disposition: A | Discharge status: Home / Self Care | Payer: Medicare Other | Source: Ambulatory Visit | Attending: Internal Medicine | Admitting: Internal Medicine

## 2019-04-15 DIAGNOSIS — M542 Cervicalgia: Secondary | ICD-10-CM

## 2019-04-15 DIAGNOSIS — E1169 Type 2 diabetes mellitus with other specified complication: Secondary | ICD-10-CM | POA: Diagnosis not present

## 2019-04-15 DIAGNOSIS — N189 Chronic kidney disease, unspecified: Secondary | ICD-10-CM | POA: Diagnosis not present

## 2019-04-15 NOTE — Progress Notes (Signed)
Subjective: Richard Shaw presents today for follow up of preventative diabetic foot care and painful porokeratotic lesion(s) plantar aspect of both feet and painful mycotic toenails b/l that limit ambulation. Aggravating factors include weightbearing with and without shoe gear. Pain for both is relieved with periodic professional debridement..   Allergies  Allergen Reactions  . Omnipaque [Iohexol] Swelling    Pt states at [facility] in [place] on 05/09/2016 he swelled all over after receiving 63mL of Omnipaque 350 concentration.   [name] Penicillins Other (See Comments)    hallucinations    Objective: Vitals:   04/09/19 1419  Temp: (!) 97.1 F (36.2 C)    Pt 69 y.o. year old AA  male WD, WN in NAD. AAO x 3.   Vascular Examination:  Capillary refill time to digits immediate b/l. Palpable DP pulses b/l. Palpable PT pulses b/l. Pedal hair sparse b/l. Skin temperature gradient within normal limits b/l.  Dermatological Examination: Pedal skin with normal turgor, texture and tone bilaterally. No open wounds bilaterally. No interdigital macerations bilaterally. Toenails 1-5 b/l elongated, dystrophic, thickened, crumbly with subungual debris and tenderness to dorsal palpation. Porokeratotic lesion(s) submet head 1, 5 right and submet head 3 left foot. No erythema, no edema, no drainage, no flocculence.  Musculoskeletal: Normal muscle strength 5/5 to all lower extremity muscle groups bilaterally, no pain crepitus or joint limitation noted with ROM b/l, bunion deformity noted b/l and hammertoes noted to the  2-5 bilaterally  Neurological: Protective sensation intact 5/5 intact bilaterally with 10g monofilament b/l Vibratory sensation intact b/l Proprioception intact bilaterally  Assessment: 1. Pain due to onychomycosis of toenails of both feet   2. Porokeratosis   3. Controlled type 2 diabetes mellitus without complication, without long-term current use of insulin ([place])     Plan: -Continue diabetic foot care principles. Literature dispensed on today.  -Toenails 1-5 b/l were debrided in length and girth with sterile nail nippers and dremel without iatrogenic bleeding.  -Painful porokeratotic lesions submet head 1, 5 right foot and submet head 3 left foot pared and enucleated with sterile scalpel blade without incident. -Patient to continue soft, supportive shoe gear daily. -Patient to report any pedal injuries to medical professional immediately. -Patient/POA to call should there be question/concern in the interim.  Return in about 9 weeks (around 06/11/2019) for nail and callus trim.

## 2019-05-27 ENCOUNTER — Ambulatory Visit (INDEPENDENT_AMBULATORY_CARE_PROVIDER_SITE_OTHER): Payer: Medicare Other | Admitting: Neurology

## 2019-05-27 ENCOUNTER — Other Ambulatory Visit: Payer: Self-pay

## 2019-05-27 ENCOUNTER — Encounter: Payer: Self-pay | Admitting: Neurology

## 2019-05-27 VITALS — BP 137/91 | HR 83 | Temp 96.7°F | Ht 69.0 in | Wt 172.0 lb

## 2019-05-27 DIAGNOSIS — M542 Cervicalgia: Secondary | ICD-10-CM | POA: Diagnosis not present

## 2019-05-27 DIAGNOSIS — E1142 Type 2 diabetes mellitus with diabetic polyneuropathy: Secondary | ICD-10-CM | POA: Diagnosis not present

## 2019-05-27 NOTE — Progress Notes (Signed)
PATIENT: Richard Shaw DOB: 10-Jan-1951  Chief Complaint  Patient presents with  . Neck Pain/Possible tension headaches    Reports neck pain that radiates up the back of his head. Denies any discomfort in shoulders or arms.   [name] PCP    [name], MD     HISTORICAL  Richard Shaw is a 69 year old male, seen in request by his primary care physician Dr. Fara Olden, Ronie Spies for evaluation of neck pain, headaches, initial evaluation was on May 27, 2019.  I have reviewed and summarized the referring note from the referring physician.  He has past medical history of diabetes, with diabetic peripheral neuropathy, hyperlipidemia,  He reported a history of chronic neck pain for more than 10 years, it flareup occasionally, oftentimes on the left side, radiating to left shoulder, occasionally to left arm involving left 1-3 fingers, there was no weakness,  Recent flareup was since March 2021, this time not only go to his left shoulder, he also noticed radiating pain from left occipital region to left parietotemporal region, sharp radiating pain lasting for few seconds, multiple times a day, wake him up from sleep, he was treated with tapering dose of prednisone on April 15, 2019, his symptoms has much improved,  He denies gait abnormality, denies bowel and bladder incontinence, denies bilateral upper extremity persistent sensory loss or motor deficit.  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  ALLERGIES: Allergies  Allergen Reactions  . Omnipaque [Iohexol] Swelling    Pt states at [facility] in [place] on 05/09/2016 he swelled all over after receiving 46mL of Omnipaque 350 concentration.   [name] Penicillins  Other (See Comments)    hallucinations    HOME MEDICATIONS: Current Outpatient Medications  Medication Sig Dispense Refill  . clotrimazole-betamethasone (LOTRISONE) cream Apply topically as needed.     . dapagliflozin propanediol (FARXIGA) 10 MG TABS tablet Take 10 mg by mouth daily.    [name] desloratadine (CLARINEX) 5 MG tablet Take 5 mg by mouth daily as needed.     [name] EPINEPHrine 0.3 mg/0.3 mL IJ SOAJ injection Inject 0.3 mg into the muscle as needed.     . famciclovir (FAMVIR) 500 MG tablet Take 500 mg by mouth daily as needed.     . gabapentin (NEURONTIN) 300 MG capsule Take 300 mg by mouth as needed.     [name] glipiZIDE (GLUCOTROL) 10 MG tablet Take 10 mg by mouth daily.     . hydrOXYzine (ATARAX/VISTARIL) 25 MG tablet TK 1 T PO Q 8 H FOR 10 DAYS PRN    . JANUVIA 100 MG tablet     . metFORMIN (GLUCOPHAGE) 500 MG tablet Take 100 mg by mouth 2 (two) times daily with a meal.     . simvastatin (ZOCOR) 10 MG tablet     . triamcinolone cream (KENALOG) 0.5 % APP EXT AA BID FOR 1 WK     No current facility-administered medications for this visit.    PAST MEDICAL HISTORY: Past Medical History:  Diagnosis Date  . Chronic kidney disease   . Diabetes mellitus without complication ([place])   . Hepatitis C infection 1969  . Herpes simplex   . History of CVA (cerebrovascular accident) 07/2017  . Hyperlipidemia   .  Neck pain   . Tension headache     PAST SURGICAL HISTORY: Past Surgical History:  Procedure Laterality Date  . None      FAMILY HISTORY: Family History  Problem Relation Age of Onset  . Breast cancer Mother   . Stomach cancer Father   . Heart disease Father     SOCIAL HISTORY: Social History   Socioeconomic History  . Marital status: Married    Spouse name: Not on file  . Number of children: 2  . Years of education: 43  . Highest education level: High school graduate  Occupational History  . Occupation: Retired  Tobacco Use  . Smoking status: Former Smoker     Types: Cigarettes  . Smokeless tobacco: Former Network engineer and Sexual Activity  . Alcohol use: Not Currently  . Drug use: Not Currently  . Sexual activity: Not on file  Other Topics Concern  . Not on file  Social History Narrative   Lives at home with his wife.   Right-handed.   One cup caffeine per day.   Social Determinants of Health   Financial Resource Strain:   . Difficulty of Paying Living Expenses:   Food Insecurity:   . Worried About Charity fundraiser in the Last Year:   . Arboriculturist in the Last Year:   Transportation Needs:   . Film/video editor (Medical):   [name] Lack of Transportation (Non-Medical):   Physical Activity:   . Days of Exercise per Week:   . Minutes of Exercise per Session:   Stress:   . Feeling of Stress :   Social Connections:   . Frequency of Communication with Friends and Family:   . Frequency of Social Gatherings with Friends and Family:   . Attends Religious Services:   . Active Member of Clubs or Organizations:   . Attends Archivist Meetings:   [name] Marital Status:   Intimate Partner Violence:   . Fear of Current or Ex-Partner:   . Emotionally Abused:   [name] Physically Abused:   . Sexually Abused:      PHYSICAL EXAM   Vitals:   05/27/19 1335  BP: (!) 137/91  Pulse: 83  Temp: (!) 96.7 F (35.9 C)  Weight: 172 lb (78 kg)  Height: 5\' 9"  (1.753 m)    Not recorded      Body mass index is 25.4 kg/m.  PHYSICAL EXAMNIATION:  Gen: NAD, conversant, well nourised, well groomed                     Cardiovascular: Regular rate rhythm, no peripheral edema, warm, nontender. Eyes: Conjunctivae clear without exudates or hemorrhage Neck: Supple, no carotid bruits. Pulmonary: Clear to auscultation bilaterally   NEUROLOGICAL EXAM:  MENTAL STATUS: Speech:    Speech is normal; fluent and spontaneous with normal comprehension.  Cognition:     Orientation to time, place and person     Normal recent and remote memory      Normal Attention span and concentration     Normal Language, naming, repeating,spontaneous speech     Fund of knowledge   CRANIAL NERVES: CN II: Visual fields are full to confrontation. Pupils are round equal and briskly reactive to light. CN III, IV, VI: extraocular movement are normal. No ptosis. CN V: Facial sensation is intact to light touch CN VII: Face is symmetric with normal eye closure  CN VIII: Hearing is normal to causal conversation. CN IX,  X: Phonation is normal. CN XI: Head turning and shoulder shrug are intact  MOTOR: Limited range of motion of right shoulder, mild limitation on right upper extremity motor examination, felt there was no weakness all 4 limbs.  REFLEXES: Reflexes are 2+ and symmetric at the biceps, triceps, knees, and ankles. Plantar responses are flexor.  SENSORY: Mildly decreased vibratory sensations to ankle level.  COORDINATION: There is no trunk or limb dysmetria noted.  GAIT/STANCE: Posture is normal. Gait is steady with normal steps, base, arm swing, and turning. Heel and toe walking are normal. Tandem gait is normal.  Romberg is absent.   DIAGNOSTIC DATA (LABS, IMAGING, TESTING) - I reviewed patient records, labs, notes, testing and imaging myself where available.   ASSESSMENT AND PLAN  Nohe Kuczmarski is a 69 y.o. male   Diabetic peripheral neuropathy Neck pain,  X-ray showed degenerative changes, most noticeable at C6-7 level,  No evidence of cervical radiculopathy, and myelopathy on examination, tenderness of left nuchal line, which likely contributed to his cervicogenic headache  After discussed with patient, he wants to hold off further evaluation at this point,  I have suggested hot compression, neck stretching exercise, as needed NSAIDs, gabapentin,  Right shoulder pain, tenderness upon deep palpitation  If he has worsening shoulder pain, may consider orthopedic evaluations,   Marcial Pacas, M.D. Ph.D.  [facility] [address] [place], [place] [address] Ph: [phone] Fax: [phone]  CC: [name], MD

## 2019-05-31 ENCOUNTER — Ambulatory Visit: Payer: Medicare Other | Admitting: Podiatry

## 2019-06-06 IMAGING — CT CT ABDOMEN AND PELVIS WITHOUT CONTRAST
2 of 4 series · 16 of 46 positions shown, 18 images · non-contrast
Comparison: None.

CLINICAL DATA: Abdominal swelling for 1 year

EXAM:
CT ABDOMEN AND PELVIS WITHOUT CONTRAST
TECHNIQUE: Multidetector CT imaging of the abdomen and pelvis was performed
following the standard protocol without IV contrast.

[Series 2: routine abdomen pelvis without · axial · non-contrast · 0.70mm/px · z∈[-1503,-1058]mm · 13 of 99 slices shown, 15 images (1 of 2)]
[im 5/99  soft-tissue]
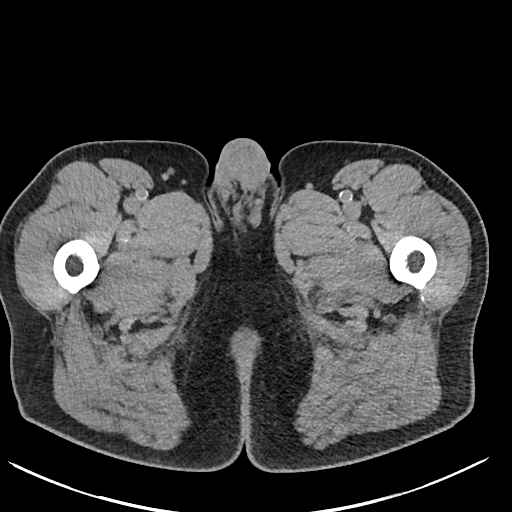
[im 5/99  bone]
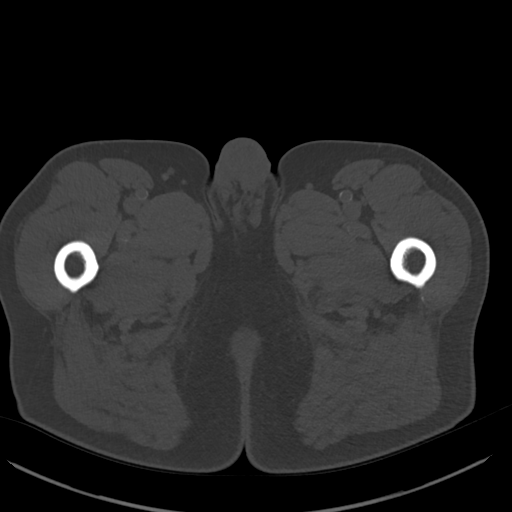
[im 13/99  soft-tissue]
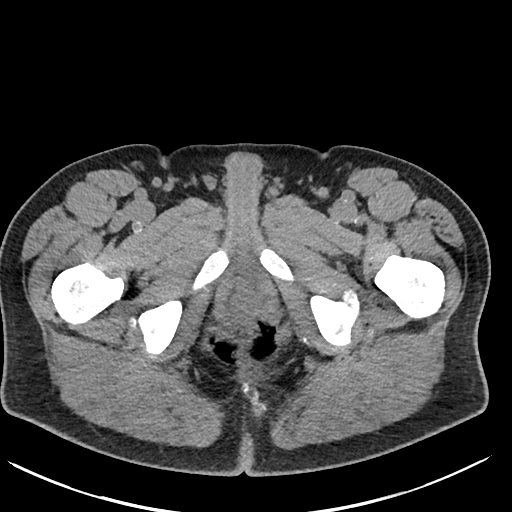
[im 22/99  soft-tissue]
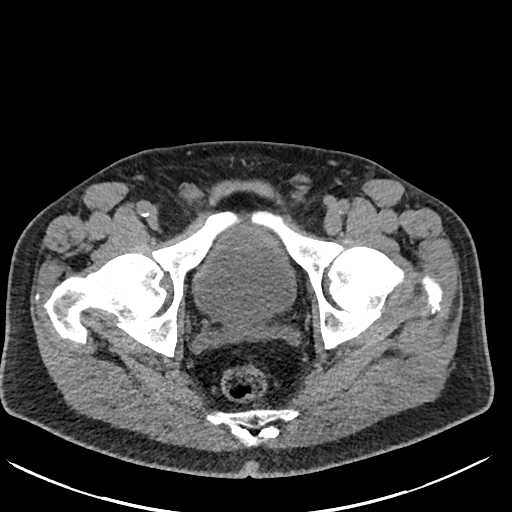
[im 26/99  soft-tissue]
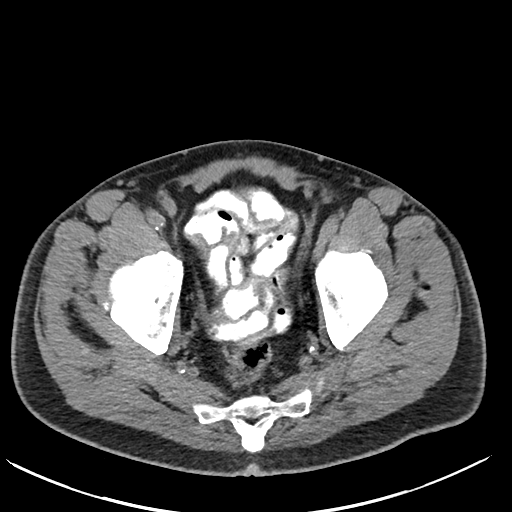
[im 35/99  soft-tissue]
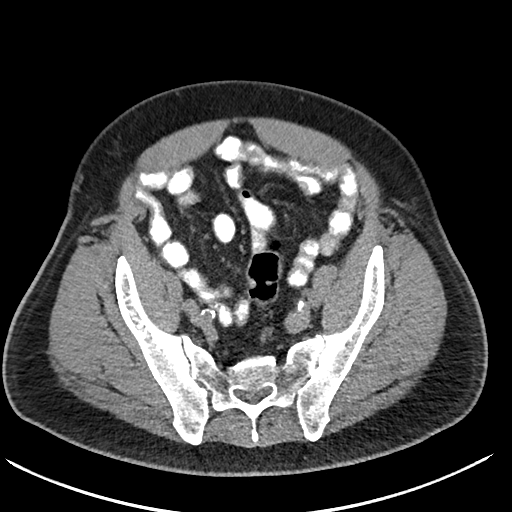
[im 43/99  soft-tissue]
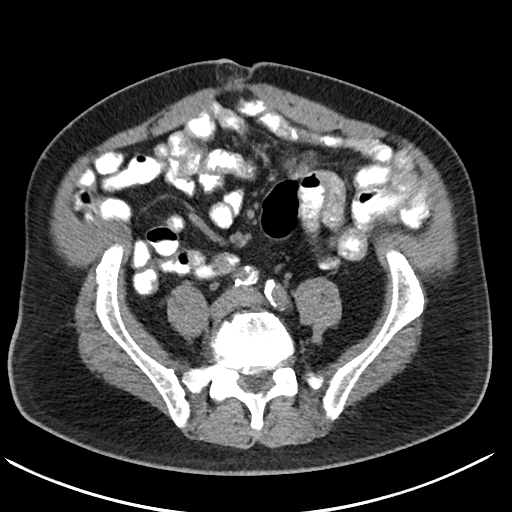
[im 52/99  soft-tissue]
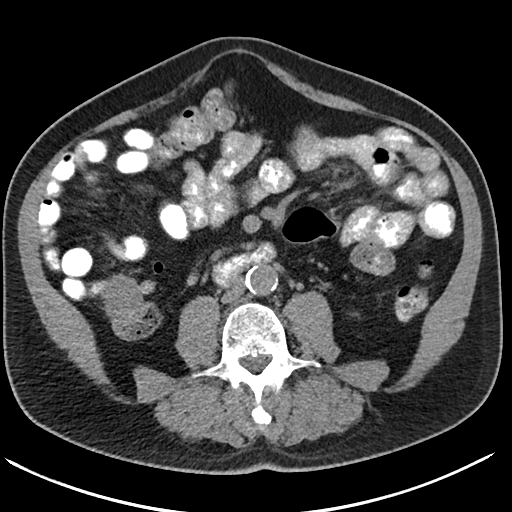
[im 56/99  soft-tissue]
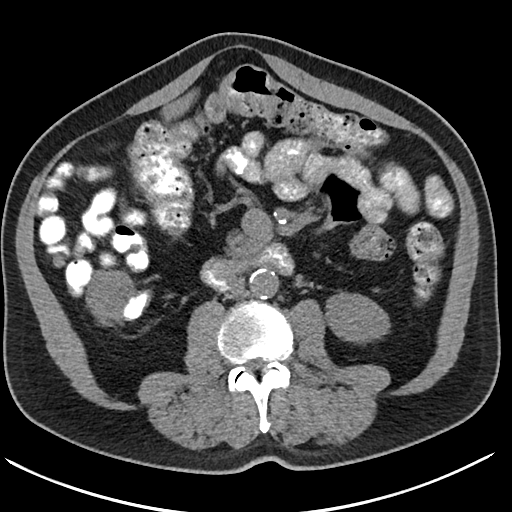
[im 64/99  soft-tissue]
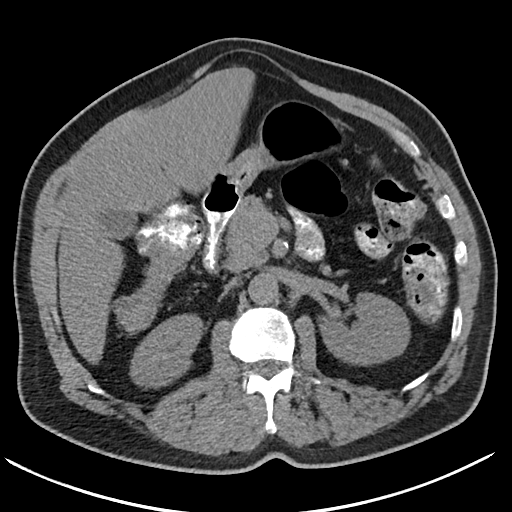
[im 64/99  bone]
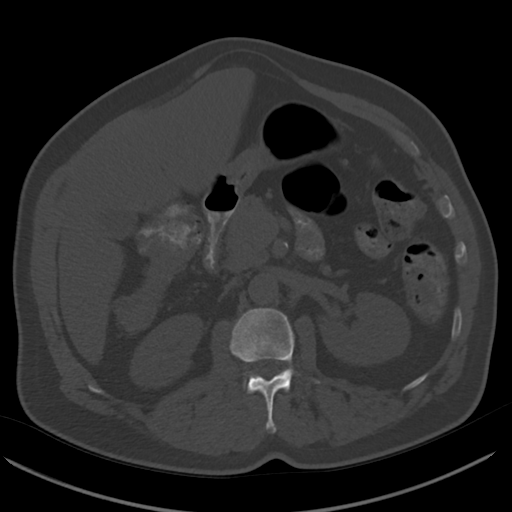
[im 73/99  soft-tissue]
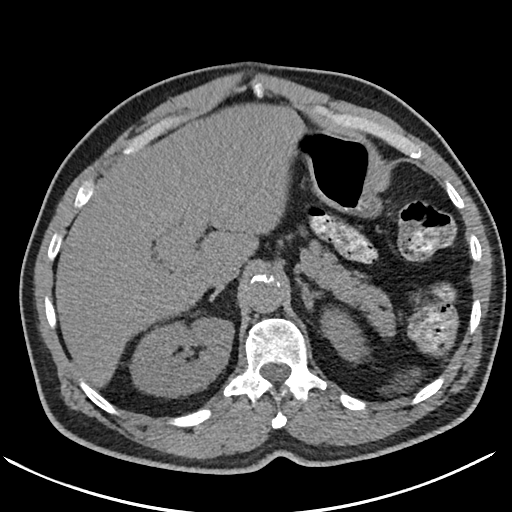
[im 77/99  soft-tissue]
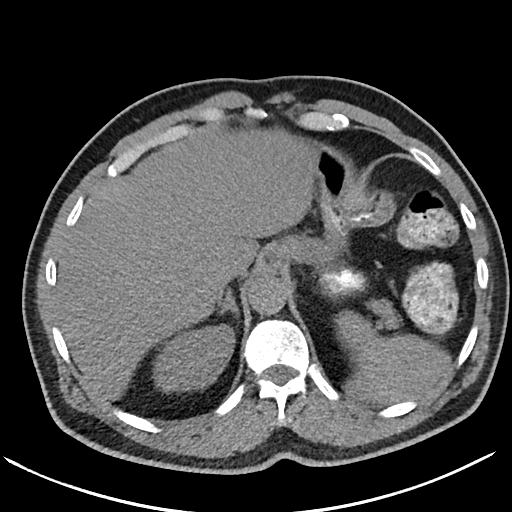
[im 86/99  soft-tissue]
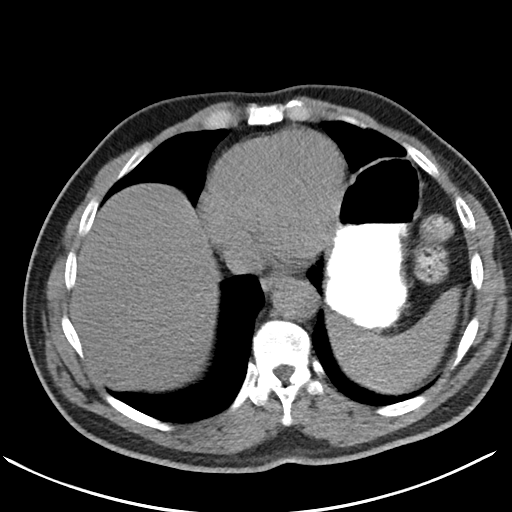
[im 94/99  soft-tissue]
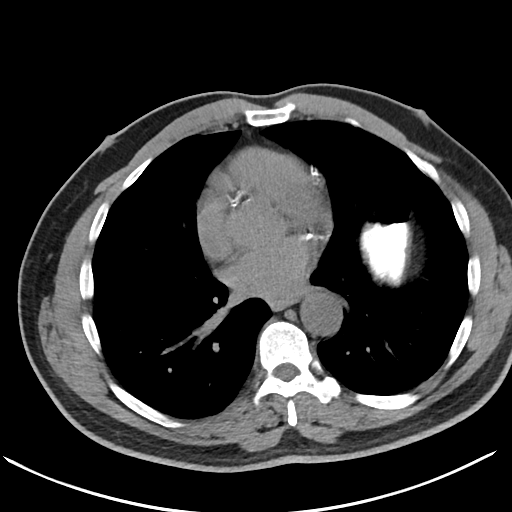

[Series 4: routine abdomen pelvis without · coronal · non-contrast · 0.70mm/px · 3 of 160 slices shown (2 of 2)]
[im 54/160  soft-tissue]
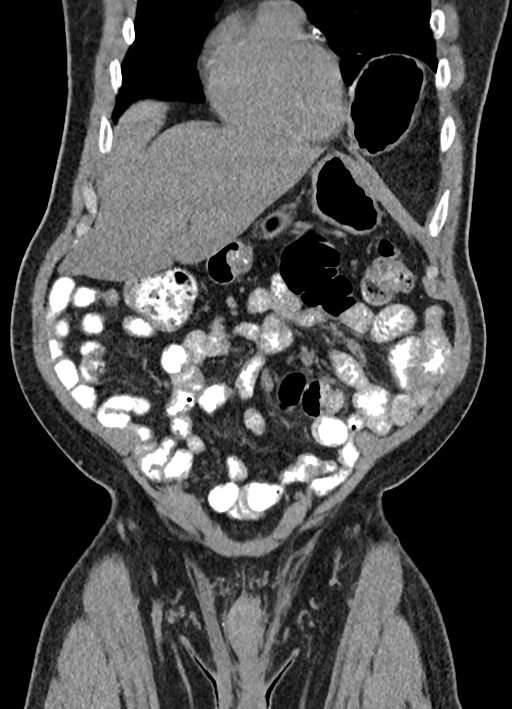
[im 71/160  soft-tissue]
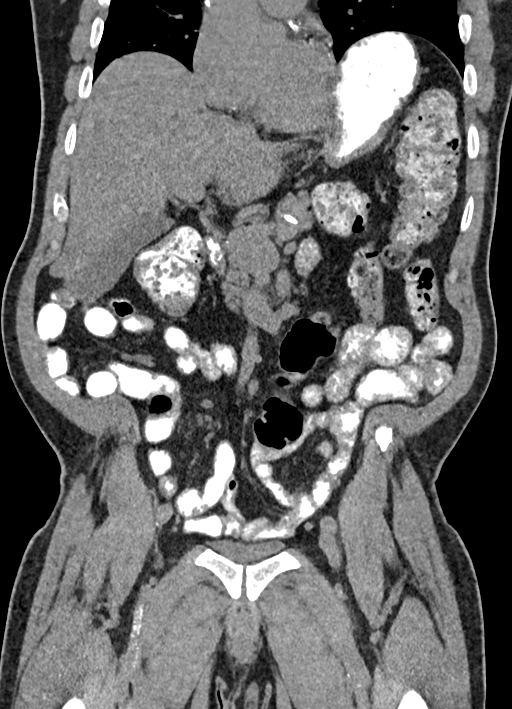
[im 89/160  soft-tissue]
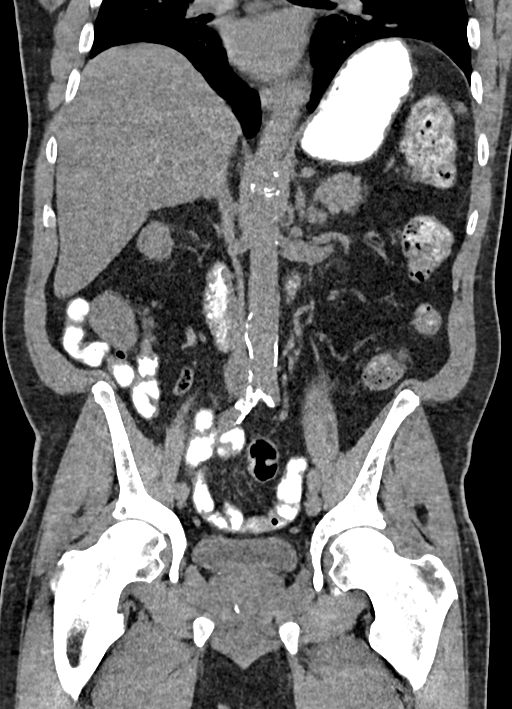

[16 of 46 positions shown; findings below may reference images not displayed]

FINDINGS: Lower chest: Lung bases are free of acute infiltrate or sizable
effusion. Diffuse coronary calcifications are noted.

Hepatobiliary: No focal liver abnormality is seen. No gallstones,
gallbladder wall thickening, or biliary dilatation.

Pancreas: Unremarkable. No pancreatic ductal dilatation or
surrounding inflammatory changes.

Spleen: Normal in size without focal abnormality.

Adrenals/Urinary Tract: Adrenal glands are within normal limits
bilaterally. A vague hypodensity is noted within the right kidney
likely representing a cyst.

Stomach/Bowel: The appendix is within normal limits. No small bowel
or large bowel abnormality is noted. The stomach is unremarkable.

Vascular/Lymphatic: Aortic atherosclerosis. No enlarged abdominal or
pelvic lymph nodes.

Reproductive: Prostate is unremarkable.

Other: No ascites is identified. Small fat containing umbilical
hernia is noted. No other herniation is seen.

Musculoskeletal: No acute or significant osseous findings.
IMPRESSION: Small fat containing umbilical hernia.

Vague hypodensity within the right kidney likely representing a
cyst.

Diffuse coronary calcifications are noted.

## 2019-06-17 ENCOUNTER — Other Ambulatory Visit: Payer: Self-pay

## 2019-06-17 ENCOUNTER — Ambulatory Visit (INDEPENDENT_AMBULATORY_CARE_PROVIDER_SITE_OTHER): Payer: Medicare Other | Admitting: Podiatry

## 2019-06-17 ENCOUNTER — Encounter: Payer: Self-pay | Admitting: Podiatry

## 2019-06-17 VITALS — Temp 97.3°F

## 2019-06-17 DIAGNOSIS — M79675 Pain in left toe(s): Secondary | ICD-10-CM

## 2019-06-17 DIAGNOSIS — M79674 Pain in right toe(s): Secondary | ICD-10-CM

## 2019-06-17 DIAGNOSIS — B351 Tinea unguium: Secondary | ICD-10-CM | POA: Diagnosis not present

## 2019-06-17 DIAGNOSIS — Q828 Other specified congenital malformations of skin: Secondary | ICD-10-CM

## 2019-06-17 DIAGNOSIS — E1142 Type 2 diabetes mellitus with diabetic polyneuropathy: Secondary | ICD-10-CM

## 2019-06-17 NOTE — Patient Instructions (Signed)
Diabetes Mellitus and Foot Care Foot care is an important part of your health, especially when you have diabetes. Diabetes may cause you to have problems because of poor blood flow (circulation) to your feet and legs, which can cause your skin to:  Become thinner and drier.  Break more easily.  Heal more slowly.  Peel and crack. You may also have nerve damage (neuropathy) in your legs and feet, causing decreased feeling in them. This means that you may not notice minor injuries to your feet that could lead to more serious problems. Noticing and addressing any potential problems early is the best way to prevent future foot problems. How to care for your feet Foot hygiene  Wash your feet daily with warm water and mild soap. Do not use hot water. Then, pat your feet and the areas between your toes until they are completely dry. Do not soak your feet as this can dry your skin.  Trim your toenails straight across. Do not dig under them or around the cuticle. File the edges of your nails with an emery board or nail file.  Apply a moisturizing lotion or petroleum jelly to the skin on your feet and to dry, brittle toenails. Use lotion that does not contain alcohol and is unscented. Do not apply lotion between your toes. Shoes and socks  Wear clean socks or stockings every day. Make sure they are not too tight. Do not wear knee-high stockings since they may decrease blood flow to your legs.  Wear shoes that fit properly and have enough cushioning. Always look in your shoes before you put them on to be sure there are no objects inside.  To break in new shoes, wear them for just a few hours a day. This prevents injuries on your feet. Wounds, scrapes, corns, and calluses  Check your feet daily for blisters, cuts, bruises, sores, and redness. If you cannot see the bottom of your feet, use a mirror or ask someone for help.  Do not cut corns or calluses or try to remove them with medicine.  If you  find a minor scrape, cut, or break in the skin on your feet, keep it and the skin around it clean and dry. You may clean these areas with mild soap and water. Do not clean the area with peroxide, alcohol, or iodine.  If you have a wound, scrape, corn, or callus on your foot, look at it several times a day to make sure it is healing and not infected. Check for: ? Redness, swelling, or pain. ? Fluid or blood. ? Warmth. ? Pus or a bad smell. General instructions  Do not cross your legs. This may decrease blood flow to your feet.  Do not use heating pads or hot water bottles on your feet. They may burn your skin. If you have lost feeling in your feet or legs, you may not know this is happening until it is too late.  Protect your feet from hot and cold by wearing shoes, such as at the beach or on hot pavement.  Schedule a complete foot exam at least once a year (annually) or more often if you have foot problems. If you have foot problems, report any cuts, sores, or bruises to your health care provider immediately. Contact a health care provider if:  You have a medical condition that increases your risk of infection and you have any cuts, sores, or bruises on your feet.  You have an injury that is not   healing.  You have redness on your legs or feet.  You feel burning or tingling in your legs or feet.  You have pain or cramps in your legs and feet.  Your legs or feet are numb.  Your feet always feel cold.  You have pain around a toenail. Get help right away if:  You have a wound, scrape, corn, or callus on your foot and: ? You have pain, swelling, or redness that gets worse. ? You have fluid or blood coming from the wound, scrape, corn, or callus. ? Your wound, scrape, corn, or callus feels warm to the touch. ? You have pus or a bad smell coming from the wound, scrape, corn, or callus. ? You have a fever. ? You have a red line going up your leg. Summary  Check your feet every day  for cuts, sores, red spots, swelling, and blisters.  Moisturize feet and legs daily.  Wear shoes that fit properly and have enough cushioning.  If you have foot problems, report any cuts, sores, or bruises to your health care provider immediately.  Schedule a complete foot exam at least once a year (annually) or more often if you have foot problems. This information is not intended to replace advice given to you by your health care provider. Make sure you discuss any questions you have with your health care provider. Document Revised: 10/10/2018 Document Reviewed: 02/19/2016 Elsevier Patient Education  2020 [employer]  Peripheral Neuropathy Peripheral neuropathy is a type of nerve damage. It affects nerves that carry signals between the spinal cord and the arms, legs, and the rest of the body (peripheral nerves). It does not affect nerves in the spinal cord or brain. In peripheral neuropathy, one nerve or a group of nerves may be damaged. Peripheral neuropathy is a broad category that includes many specific nerve disorders, like diabetic neuropathy, hereditary neuropathy, and carpal tunnel syndrome. What are the causes? This condition may be caused by:  Diabetes. This is the most common cause of peripheral neuropathy.  Nerve injury.  Pressure or stress on a nerve that lasts a long time.  Lack (deficiency) of B vitamins. This can result from alcoholism, poor diet, or a restricted diet.  Infections.  Autoimmune diseases, such as rheumatoid arthritis and systemic lupus erythematosus.  Nerve diseases that are passed from parent to child (inherited).  Some medicines, such as cancer medicines (chemotherapy).  Poisonous (toxic) substances, such as lead and mercury.  Too little blood flowing to the legs.  Kidney disease.  Thyroid disease. In some cases, the cause of this condition is not known. What are the signs or symptoms? Symptoms of this condition depend on which of your  nerves is damaged. Common symptoms include:  Loss of feeling (numbness) in the feet, hands, or both.  Tingling in the feet, hands, or both.  Burning pain.  Very sensitive skin.  Weakness.  Not being able to move a part of the body (paralysis).  Muscle twitching.  Clumsiness or poor coordination.  Loss of balance.  Not being able to control your bladder.  Feeling dizzy.  Sexual problems. How is this diagnosed? Diagnosing and finding the cause of peripheral neuropathy can be difficult. Your health care provider will take your medical history and do a physical exam. A neurological exam will also be done. This involves checking things that are affected by your brain, spinal cord, and nerves (nervous system). For example, your health care provider will check your reflexes, how you move, and   what you can feel. You may have other tests, such as:  Blood tests.  Electromyogram (EMG) and nerve conduction tests. These tests check nerve function and how well the nerves are controlling the muscles.  Imaging tests, such as CT scans or MRI to rule out other causes of your symptoms.  Removing a small piece of nerve to be examined in a lab (nerve biopsy). This is rare.  Removing and examining a small amount of the fluid that surrounds the brain and spinal cord (lumbar puncture). This is rare. How is this treated? Treatment for this condition may involve:  Treating the underlying cause of the neuropathy, such as diabetes, kidney disease, or vitamin deficiencies.  Stopping medicines that can cause neuropathy, such as chemotherapy.  Medicine to relieve pain. Medicines may include: ? Prescription or over-the-counter pain medicine. ? Antiseizure medicine. ? Antidepressants. ? Pain-relieving patches that are applied to painful areas of skin.  Surgery to relieve pressure on a nerve or to destroy a nerve that is causing pain.  Physical therapy to help improve movement and  balance.  Devices to help you move around (assistive devices). Follow these instructions at home: Medicines  Take over-the-counter and prescription medicines only as told by your health care provider. Do not take any other medicines without first asking your health care provider.  Do not drive or use heavy machinery while taking prescription pain medicine. Lifestyle   Do not use any products that contain nicotine or tobacco, such as cigarettes and e-cigarettes. Smoking keeps blood from reaching damaged nerves. If you need help quitting, ask your health care provider.  Avoid or limit alcohol. Too much alcohol can cause a vitamin B deficiency, and vitamin B is needed for healthy nerves.  Eat a healthy diet. This includes: ? Eating foods that are high in fiber, such as fresh fruits and vegetables, whole grains, and beans. ? Limiting foods that are high in fat and processed sugars, such as fried or sweet foods. General instructions   If you have diabetes, work closely with your health care provider to keep your blood sugar under control.  If you have numbness in your feet: ? Check every day for signs of injury or infection. Watch for redness, warmth, and swelling. ? Wear padded socks and comfortable shoes. These help protect your feet.  Develop a good support system. Living with peripheral neuropathy can be stressful. Consider talking with a mental health specialist or joining a support group.  Use assistive devices and attend physical therapy as told by your health care provider. This may include using a walker or a cane.  Keep all follow-up visits as told by your health care provider. This is important. Contact a health care provider if:  You have new signs or symptoms of peripheral neuropathy.  You are struggling emotionally from dealing with peripheral neuropathy.  Your pain is not well-controlled. Get help right away if:  You have an injury or infection that is not healing  normally.  You develop new weakness in an arm or leg.  You fall frequently. Summary  Peripheral neuropathy is when the nerves in the arms, or legs are damaged, resulting in numbness, weakness, or pain.  There are many causes of peripheral neuropathy, including diabetes, pinched nerves, vitamin deficiencies, autoimmune disease, and hereditary conditions.  Diagnosing and finding the cause of peripheral neuropathy can be difficult. Your health care provider will take your medical history, do a physical exam, and do tests, including blood tests and nerve function tests.    Treatment involves treating the underlying cause of the neuropathy and taking medicines to help control pain. Physical therapy and assistive devices may also help. This information is not intended to replace advice given to you by your health care provider. Make sure you discuss any questions you have with your health care provider. Document Revised: 12/30/2016 Document Reviewed: 03/28/2016 Elsevier Patient Education  2020 Elsevier Inc.  

## 2019-06-23 NOTE — Progress Notes (Signed)
Subjective: Richard Shaw presents today preventative diabetic foot care and painful porokeratotic lesion(s) b/l feet and painful mycotic toenails b/l that limit ambulation. Aggravating factors include weightbearing with and without shoe gear. Pain for both is relieved with periodic professional debridement.   He has h/o neuropathy and takes gabapentin.  [name], MD is patient's PCP. Last visit was: 02/21/2019.  Past Medical History:  Diagnosis Date  . Chronic kidney disease   . Diabetes mellitus without complication ([place])   . Hepatitis C infection 1969  . Herpes simplex   . History of CVA (cerebrovascular accident) 07/2017  . Hyperlipidemia   . Neck pain   . Tension headache      Current Outpatient Medications on File Prior to Visit  Medication Sig Dispense Refill  . clotrimazole-betamethasone (LOTRISONE) cream Apply topically as needed.     . dapagliflozin propanediol (FARXIGA) 10 MG TABS tablet Take 10 mg by mouth daily.    [name] desloratadine (CLARINEX) 5 MG tablet Take 5 mg by mouth daily as needed.     [name] EPINEPHrine 0.3 mg/0.3 mL IJ SOAJ injection Inject 0.3 mg into the muscle as needed.     . famciclovir (FAMVIR) 500 MG tablet Take 500 mg by mouth daily as needed.     . gabapentin (NEURONTIN) 300 MG capsule Take 300 mg by mouth as needed.     [name] glipiZIDE (GLUCOTROL) 10 MG tablet Take 10 mg by mouth daily.     . hydrOXYzine (ATARAX/VISTARIL) 25 MG tablet TK 1 T PO Q 8 H FOR 10 DAYS PRN    . JANUVIA 100 MG tablet     . metFORMIN (GLUCOPHAGE) 500 MG tablet Take 100 mg by mouth 2 (two) times daily with a meal.     . simvastatin (ZOCOR) 10 MG tablet     . triamcinolone cream (KENALOG) 0.5 % APP EXT AA BID FOR 1 WK     No current facility-administered medications on file prior to visit.     Allergies  Allergen Reactions  . Omnipaque [Iohexol] Swelling    Pt states at [facility] in [place] on 05/09/2016 he swelled all over after receiving 82mL of Omnipaque 350  concentration.   [name] Penicillins Other (See Comments)    hallucinations   Objective: Richard Shaw is a pleasant 69 y.o. y.o. Patient Race: Black or African American [2]  male in NAD. AAO x 3.  Vitals:   06/17/19 0831  Temp: (!) 97.3 F (36.3 C)    Vascular Examination: Capillary refill time to digits immediate b/l. Palpable DP pulses b/l. Palpable PT pulses b/l. Pedal hair sparse b/l. Skin temperature gradient within normal limits b/l. No edema noted b/l.  Dermatological Examination: Pedal skin with normal turgor, texture and tone bilaterally. No open wounds bilaterally. No interdigital macerations bilaterally. Toenails 1-5 b/l elongated, dystrophic, thickened, crumbly with subungual debris and tenderness to dorsal palpation. Porokeratotic lesion(s) submet head 1 right foot, submet head 3 left foot and submet head 5 right foot. No erythema, no edema, no drainage, no flocculence.  Musculoskeletal: Normal muscle strength 5/5 to all lower extremity muscle groups bilaterally. No pain crepitus or joint limitation noted with ROM b/l. Hallux valgus with bunion deformity noted b/l. Hammertoes noted to the 2-5 bilaterally.  Neurological Examination: Pt has subjective symptoms of neuropathy managed with oral medication. Protective sensation intact 5/5 intact bilaterally with 10g monofilament b/l. Vibratory sensation intact b/l. Proprioception intact bilaterally. Clonus negative b/l.  Assessment: 1. Pain due to onychomycosis of toenails of both  feet   2. Porokeratosis   3. Diabetic peripheral neuropathy ([place])   Plan: -Examined patient. -No new findings. No new orders. -Continue diabetic foot care principles. Literature dispensed on today.  -Toenails 1-5 b/l were debrided in length and girth with sterile nail nippers and dremel without iatrogenic bleeding.  -Callus(es) submet head 1 right foot, submet head 3 left foot and submet head 5 right foot pared utilizing sterile scalpel blade without  complication or incident. Total number debrided =3. -Patient to continue soft, supportive shoe gear daily. -Patient to report any pedal injuries to medical professional immediately. -Patient/POA to call should there be question/concern in the interim.  Return in about 3 months (around 09/17/2019) for diabetic nail and callus trim.  Marzetta Board, DPM

## 2019-06-24 ENCOUNTER — Ambulatory Visit: Payer: Medicare Other | Admitting: Podiatry

## 2019-06-24 ENCOUNTER — Ambulatory Visit: Payer: Managed Care, Other (non HMO) | Admitting: Podiatry

## 2019-08-15 DIAGNOSIS — R7309 Other abnormal glucose: Secondary | ICD-10-CM | POA: Diagnosis not present

## 2019-08-15 DIAGNOSIS — G629 Polyneuropathy, unspecified: Secondary | ICD-10-CM | POA: Diagnosis not present

## 2019-08-15 DIAGNOSIS — R200 Anesthesia of skin: Secondary | ICD-10-CM | POA: Diagnosis not present

## 2019-08-19 ENCOUNTER — Encounter: Payer: Self-pay | Admitting: Podiatry

## 2019-08-19 ENCOUNTER — Ambulatory Visit (INDEPENDENT_AMBULATORY_CARE_PROVIDER_SITE_OTHER): Payer: Medicare Other | Admitting: Podiatry

## 2019-08-19 ENCOUNTER — Other Ambulatory Visit: Payer: Self-pay

## 2019-08-19 DIAGNOSIS — M2042 Other hammer toe(s) (acquired), left foot: Secondary | ICD-10-CM

## 2019-08-19 DIAGNOSIS — B351 Tinea unguium: Secondary | ICD-10-CM

## 2019-08-19 DIAGNOSIS — E119 Type 2 diabetes mellitus without complications: Secondary | ICD-10-CM

## 2019-08-19 DIAGNOSIS — M79674 Pain in right toe(s): Secondary | ICD-10-CM | POA: Diagnosis not present

## 2019-08-19 DIAGNOSIS — M79675 Pain in left toe(s): Secondary | ICD-10-CM | POA: Diagnosis not present

## 2019-08-19 DIAGNOSIS — Q828 Other specified congenital malformations of skin: Secondary | ICD-10-CM

## 2019-08-19 DIAGNOSIS — M2011 Hallux valgus (acquired), right foot: Secondary | ICD-10-CM

## 2019-08-19 DIAGNOSIS — M2041 Other hammer toe(s) (acquired), right foot: Secondary | ICD-10-CM | POA: Diagnosis not present

## 2019-08-19 DIAGNOSIS — E1142 Type 2 diabetes mellitus with diabetic polyneuropathy: Secondary | ICD-10-CM | POA: Diagnosis not present

## 2019-08-19 DIAGNOSIS — M2012 Hallux valgus (acquired), left foot: Secondary | ICD-10-CM

## 2019-08-22 NOTE — Progress Notes (Signed)
Subjective: Richard Shaw presents today for for annual diabetic foot examination and painful callus(es) bilaterally and painful thick toenails that are difficult to trim. Pain interferes with ambulation. Aggravating factors include wearing enclosed shoe gear. Pain is relieved with periodic professional debridement.[name], MD is patient's PCP. Last visit   Past Medical History:  Diagnosis Date  . Chronic kidney disease   . Diabetes mellitus without complication ([place])   . Hepatitis C infection 1969  . Herpes simplex   . History of CVA (cerebrovascular accident) 07/2017  . Hyperlipidemia   . Neck pain   . Tension headache     Patient Active Problem List   Diagnosis Date Noted  . Diabetic peripheral neuropathy ([place]) 05/27/2019  . Neck pain 05/27/2019  . DM (diabetes mellitus) ([place]) 01/30/2018  . Herpes 01/30/2018  . Herpesvirus infection 03/15/2017    Past Surgical History:  Procedure Laterality Date  . None      Current Outpatient Medications on File Prior to Visit  Medication Sig Dispense Refill  . clotrimazole-betamethasone (LOTRISONE) cream Apply topically as needed.     . dapagliflozin propanediol (FARXIGA) 10 MG TABS tablet Take 10 mg by mouth daily.    [name] desloratadine (CLARINEX) 5 MG tablet Take 5 mg by mouth daily as needed.     [name] EPINEPHrine 0.3 mg/0.3 mL IJ SOAJ injection Inject 0.3 mg into the muscle as needed.     . famciclovir (FAMVIR) 500 MG tablet Take 500 mg by mouth daily as needed.     . gabapentin (NEURONTIN) 300 MG capsule Take 300 mg by mouth as needed.     [name] glipiZIDE (GLUCOTROL) 10 MG tablet Take 10 mg by mouth daily.     . hydrOXYzine (ATARAX/VISTARIL) 25 MG tablet TK 1 T PO Q 8 H FOR 10 DAYS PRN    . JANUVIA 100 MG tablet     . metFORMIN (GLUCOPHAGE) 1000 MG tablet Take 1,000 mg by mouth 2 (two) times daily.    . metFORMIN (GLUCOPHAGE) 500 MG tablet Take 100 mg by mouth 2 (two) times daily with a meal.     . simvastatin (ZOCOR) 10 MG  tablet     . triamcinolone cream (KENALOG) 0.5 % APP EXT AA BID FOR 1 WK     No current facility-administered medications on file prior to visit.     Allergies  Allergen Reactions  . Omnipaque [Iohexol] Swelling    Pt states at [facility] in [place] on 05/09/2016 he swelled all over after receiving 46mL of Omnipaque 350 concentration.   [name] Penicillins Other (See Comments)    hallucinations    Social History   Occupational History  . Occupation: Retired  Tobacco Use  . Smoking status: Former Smoker    Types: Cigarettes  . Smokeless tobacco: Former Network engineer and Sexual Activity  . Alcohol use: Not Currently  . Drug use: Not Currently  . Sexual activity: Not on file    Family History  Problem Relation Age of Onset  . Breast cancer Mother   . Stomach cancer Father   . Heart disease Father      There is no immunization history on file for this patient.   Objective: There were no vitals filed for this visit.  Richard Shaw is a pleasant 69 y.o. male in NAD. AAO X 3.  Vascular Examination: Neurovascular status unchanged b/l lower extremities. Capillary refill time to digits immediate b/l. Palpable pedal pulses b/l LE. Pedal hair sparse. Lower extremity  skin temperature gradient within normal limits. No pain with calf compression b/l. No edema noted b/l lower extremities. No ischemia or gangrene noted b/l lower extremities.  Dermatological Examination: Pedal skin with normal turgor, texture and tone bilaterally. No open wounds bilaterally. No interdigital macerations bilaterally. Toenails 1-5 b/l elongated, discolored, dystrophic, thickened, crumbly with subungual debris and tenderness to dorsal palpation. Porokeratotic lesion(s) submet head 1 right foot, submet head 3 left foot and submet head 5 right foot. No erythema, no edema, no drainage, no flocculence.  Musculoskeletal Examination: Normal muscle strength 5/5 to all lower extremity muscle groups bilaterally.  No pain crepitus or joint limitation noted with ROM b/l. Hallux valgus with bunion deformity noted b/l lower extremities. Hammertoes noted to the 2-5 bilaterally.  Footwear Assessment: Does the patient wear appropriate shoes? Yes. Does the patient need inserts/orthotics? Yes.  Neurological Examination: Pt has subjective symptoms of neuropathy. Protective sensation intact 5/5 intact bilaterally with 10g monofilament b/l. Vibratory sensation intact b/l. Proprioception intact bilaterally. Babinski reflex negative b/l. Clonus negative b/l.  Assessment: No diagnosis found.   ADA Foot Risk Categorization: Low Risk :  Patient has all of the following: Intact protective sensation No prior foot ulcer  No severe deformity Pedal pulses present  Plan: -Examined patient. -Diabetic foot examination performed on today's visit. -Continue diabetic foot care principles. -Toenails 1-5 b/l were debrided in length and girth with sterile nail nippers and dremel without iatrogenic bleeding.  -Painful porokeratotic lesion(s) submet head 1 right foot, submet head 3 left foot and submet head 5 right foot pared and enucleated with sterile scalpel blade without incident. -Patient to report any pedal injuries to medical professional immediately. -Patient to continue soft, supportive shoe gear daily. Start procedure for diabetic shoes. Patient qualifies based on diagnoses. -Patient to continue soft, supportive shoe gear daily. -Patient/POA to call should there be question/concern in the interim.  Return in about 9 weeks (around 10/21/2019) for diabetic nail and callus trim.  Marzetta Board, DPM

## 2019-08-23 ENCOUNTER — Ambulatory Visit: Payer: Managed Care, Other (non HMO) | Admitting: Podiatry

## 2019-09-13 ENCOUNTER — Other Ambulatory Visit: Payer: Medicare Other | Admitting: Orthotics

## 2019-09-13 ENCOUNTER — Other Ambulatory Visit: Payer: Self-pay

## 2019-10-03 DIAGNOSIS — L209 Atopic dermatitis, unspecified: Secondary | ICD-10-CM | POA: Diagnosis not present

## 2019-10-03 DIAGNOSIS — E1169 Type 2 diabetes mellitus with other specified complication: Secondary | ICD-10-CM | POA: Diagnosis not present

## 2019-10-03 DIAGNOSIS — Z7984 Long term (current) use of oral hypoglycemic drugs: Secondary | ICD-10-CM | POA: Diagnosis not present

## 2019-10-03 DIAGNOSIS — R200 Anesthesia of skin: Secondary | ICD-10-CM | POA: Diagnosis not present

## 2019-10-04 DIAGNOSIS — Z23 Encounter for immunization: Secondary | ICD-10-CM | POA: Diagnosis not present

## 2019-10-22 ENCOUNTER — Encounter: Payer: Self-pay | Admitting: Podiatry

## 2019-10-22 ENCOUNTER — Ambulatory Visit (INDEPENDENT_AMBULATORY_CARE_PROVIDER_SITE_OTHER): Payer: Medicare Other | Admitting: Podiatry

## 2019-10-22 ENCOUNTER — Other Ambulatory Visit: Payer: Self-pay

## 2019-10-22 DIAGNOSIS — Q828 Other specified congenital malformations of skin: Secondary | ICD-10-CM

## 2019-10-22 DIAGNOSIS — M79674 Pain in right toe(s): Secondary | ICD-10-CM

## 2019-10-22 DIAGNOSIS — E1142 Type 2 diabetes mellitus with diabetic polyneuropathy: Secondary | ICD-10-CM | POA: Diagnosis not present

## 2019-10-22 DIAGNOSIS — B351 Tinea unguium: Secondary | ICD-10-CM

## 2019-10-22 DIAGNOSIS — M2012 Hallux valgus (acquired), left foot: Secondary | ICD-10-CM

## 2019-10-22 DIAGNOSIS — M2011 Hallux valgus (acquired), right foot: Secondary | ICD-10-CM

## 2019-10-22 DIAGNOSIS — M79675 Pain in left toe(s): Secondary | ICD-10-CM

## 2019-10-22 NOTE — Progress Notes (Signed)
Subjective: Richard Shaw presents today for preventative diabetic foot care and painful callus(es) bilaterally and painful thick toenails that are difficult to trim. Painful toenails interfere with ambulation. Aggravating factors include wearing enclosed shoe gear. Pain is relieved with periodic professional debridement. Painful calluses are aggravated when weightbearing with and without shoegear. Pain is relieved with periodic professional debridement.   Richard Shaw states his blood sugar was in the 170's yesterday. His last A1c was 6.9%. He states he has started a gluten free diet and he and his wife have joined their local [employer].  Richard Cha, MD is patient's PCP. Last visit was 04/15/2019.  Past Medical History:  Diagnosis Date  . Chronic kidney disease   . Diabetes mellitus without complication ([place])   . Hepatitis C infection 1969  . Herpes simplex   . History of CVA (cerebrovascular accident) 07/2017  . Hyperlipidemia   . Neck pain   . Tension headache     Patient Active Problem List   Diagnosis Date Noted  . Diabetic peripheral neuropathy ([place]) 05/27/2019  . Neck pain 05/27/2019  . DM (diabetes mellitus) ([place]) 01/30/2018  . Herpes 01/30/2018  . Herpesvirus infection 03/15/2017    Past Surgical History:  Procedure Laterality Date  . None      Current Outpatient Medications on File Prior to Visit  Medication Sig Dispense Refill  . clotrimazole-betamethasone (LOTRISONE) cream Apply topically as needed.     . dapagliflozin propanediol (FARXIGA) 10 MG TABS tablet Take 10 mg by mouth daily.    [name] desloratadine (CLARINEX) 5 MG tablet Take 5 mg by mouth daily as needed.     [name] EPINEPHrine 0.3 mg/0.3 mL IJ SOAJ injection Inject 0.3 mg into the muscle as needed.     . famciclovir (FAMVIR) 500 MG tablet Take 500 mg by mouth daily as needed.     . gabapentin (NEURONTIN) 300 MG capsule Take 300 mg by mouth as needed.     [name] glipiZIDE (GLUCOTROL) 10 MG tablet Take 10 mg by  mouth daily.     . hydrOXYzine (ATARAX/VISTARIL) 25 MG tablet TK 1 T PO Q 8 H FOR 10 DAYS PRN    . JANUVIA 100 MG tablet     . metFORMIN (GLUCOPHAGE) 1000 MG tablet Take 1,000 mg by mouth 2 (two) times daily.    . metFORMIN (GLUCOPHAGE) 500 MG tablet Take 100 mg by mouth 2 (two) times daily with a meal.     . simvastatin (ZOCOR) 10 MG tablet     . triamcinolone cream (KENALOG) 0.5 % APP EXT AA BID FOR 1 WK     No current facility-administered medications on file prior to visit.     Allergies  Allergen Reactions  . Omnipaque [Iohexol] Swelling    Pt states at [facility] Radiology in [place] on 05/09/2016 he swelled all over after receiving 75mL of Omnipaque 350 concentration.   [name] Penicillins Other (See Comments)    hallucinations    Social History   Occupational History  . Occupation: Retired  Tobacco Use  . Smoking status: Former Smoker    Types: Cigarettes  . Smokeless tobacco: Former Network engineer and Sexual Activity  . Alcohol use: Not Currently  . Drug use: Not Currently  . Sexual activity: Not on file    Family History  Problem Relation Age of Onset  . Breast cancer Mother   . Stomach cancer Father   . Heart disease Father      There is no immunization history on  file for this patient.   Objective: There were no vitals filed for this visit.  Richard Shaw is a pleasant 69 y.o. African American male in NAD. AAO X 3.  Vascular Examination: Neurovascular status unchanged b/l lower extremities. Capillary refill time to digits immediate b/l. Palpable pedal pulses b/l LE. Pedal hair sparse. Lower extremity skin temperature gradient within normal limits. No pain with calf compression b/l. No edema noted b/l lower extremities. No ischemia or gangrene noted b/l lower extremities.  Dermatological Examination: Pedal skin with normal turgor, texture and tone bilaterally. No open wounds bilaterally. No interdigital macerations bilaterally. Toenails 1-5 b/l elongated,  discolored, dystrophic, thickened, crumbly with subungual debris and tenderness to dorsal palpation. Porokeratotic lesion(s) submet head 1 right foot, submet head 3 left foot and submet head 5 right foot. No erythema, no edema, no drainage, no flocculence.  Musculoskeletal Examination: Normal muscle strength 5/5 to all lower extremity muscle groups bilaterally. No pain crepitus or joint limitation noted with ROM b/l. Hallux valgus with bunion deformity noted b/l lower extremities. Hammertoes noted to the 2-5 bilaterally.  Neurological Examination: Pt has subjective symptoms of neuropathy. Protective sensation intact 5/5 intact bilaterally with 10g monofilament b/l. Vibratory sensation intact b/l. Proprioception intact bilaterally. Babinski reflex negative b/l. Clonus negative b/l.  Assessment: 1. Pain due to onychomycosis of toenails of both feet   2. Porokeratosis   3. Hallux valgus, acquired, bilateral   4. Diabetic peripheral neuropathy associated with type 2 diabetes mellitus ([place])      Plan: -Examined patient. -Diabetic foot examination performed on today's visit. -Continue diabetic foot care principles. -Toenails 1-5 b/l were debrided in length and girth with sterile nail nippers and dremel without iatrogenic bleeding.  -Painful porokeratotic lesion(s) submet head 1 right foot, submet head 3 left foot and submet head 5 right foot pared and enucleated with sterile scalpel blade without incident. -Patient to report any pedal injuries to medical professional immediately. -Patient to continue soft, supportive shoe gear daily. Start procedure for diabetic shoes. Patient qualifies based on diagnoses. -Patient to continue soft, supportive shoe gear daily. -Patient/POA to call should there be question/concern in the interim.  Return in about 9 weeks (around 12/24/2019).  Marzetta Board, DPM

## 2019-11-13 ENCOUNTER — Other Ambulatory Visit: Payer: Self-pay

## 2019-11-13 ENCOUNTER — Ambulatory Visit: Payer: Medicare Other | Admitting: Orthotics

## 2019-11-13 DIAGNOSIS — M2012 Hallux valgus (acquired), left foot: Secondary | ICD-10-CM | POA: Diagnosis not present

## 2019-11-13 DIAGNOSIS — M2041 Other hammer toe(s) (acquired), right foot: Secondary | ICD-10-CM | POA: Diagnosis not present

## 2019-11-13 DIAGNOSIS — E1142 Type 2 diabetes mellitus with diabetic polyneuropathy: Secondary | ICD-10-CM | POA: Diagnosis not present

## 2019-11-13 DIAGNOSIS — M2011 Hallux valgus (acquired), right foot: Secondary | ICD-10-CM | POA: Diagnosis not present

## 2019-11-13 DIAGNOSIS — M2042 Other hammer toe(s) (acquired), left foot: Secondary | ICD-10-CM | POA: Diagnosis not present

## 2019-11-27 DIAGNOSIS — Z23 Encounter for immunization: Secondary | ICD-10-CM | POA: Diagnosis not present

## 2019-12-30 ENCOUNTER — Other Ambulatory Visit: Payer: Self-pay

## 2019-12-30 ENCOUNTER — Ambulatory Visit (INDEPENDENT_AMBULATORY_CARE_PROVIDER_SITE_OTHER): Payer: Medicare Other | Admitting: Podiatry

## 2019-12-30 ENCOUNTER — Encounter: Payer: Self-pay | Admitting: Podiatry

## 2019-12-30 DIAGNOSIS — M79674 Pain in right toe(s): Secondary | ICD-10-CM

## 2019-12-30 DIAGNOSIS — B351 Tinea unguium: Secondary | ICD-10-CM

## 2019-12-30 DIAGNOSIS — Q828 Other specified congenital malformations of skin: Secondary | ICD-10-CM

## 2019-12-30 DIAGNOSIS — E1142 Type 2 diabetes mellitus with diabetic polyneuropathy: Secondary | ICD-10-CM

## 2019-12-30 DIAGNOSIS — M79675 Pain in left toe(s): Secondary | ICD-10-CM

## 2019-12-30 NOTE — Progress Notes (Signed)
This patient returns to my office for at risk foot care.  This patient requires this care by a professional since this patient will be at risk due to having diabetes, chronic kidney disease. and neuropathy.  This patient is unable to cut nails himself since the patient cannot reach his nails.These nails are painful walking and wearing shoes.   Patient also has callus noted on both forefeet.   This patient presents for at risk foot care today.  General Appearance  Alert, conversant and in no acute stress.  Vascular  Dorsalis pedis and posterior tibial  pulses are palpable  bilaterally.  Capillary return is within normal limits  bilaterally. Temperature is within normal limits  bilaterally.  Neurologic  Senn-Weinstein monofilament wire test within normal limits  bilaterally. Muscle power within normal limits bilaterally.  Nails Thick disfigured discolored nails with subungual debris  from hallux to fifth toes bilaterally. No evidence of bacterial infection or drainage bilaterally.  Orthopedic  No limitations of motion  feet .  No crepitus or effusions noted.  HAV  B/L with hammer toes 2-5  B/L.  Skin  normotropic skin with no porokeratosis noted bilaterally.  No signs of infections or ulcers noted.  Porokeratosis sub 4 left and sub 1,5 right foot.   Onychomycosis  Pain in right toes  Pain in left toes  Consent was obtained for treatment procedures.   Mechanical debridement of nails 1-5  bilaterally performed with a nail nipper.  Filed with dremel without incident. Debridement of porokeratosis with # 15 blade.   Return office visit   3 months.                  Told patient to return for periodic foot care and evaluation due to potential at risk complications.   Gardiner Barefoot DPM

## 2020-01-21 ENCOUNTER — Ambulatory Visit: Payer: Medicare Other | Admitting: Podiatry

## 2020-02-14 DIAGNOSIS — E1169 Type 2 diabetes mellitus with other specified complication: Secondary | ICD-10-CM | POA: Diagnosis not present

## 2020-02-14 DIAGNOSIS — G43109 Migraine with aura, not intractable, without status migrainosus: Secondary | ICD-10-CM | POA: Diagnosis not present

## 2020-03-30 ENCOUNTER — Other Ambulatory Visit: Payer: Self-pay

## 2020-03-30 ENCOUNTER — Ambulatory Visit: Payer: Medicare Other | Admitting: Podiatry

## 2020-03-30 ENCOUNTER — Encounter: Payer: Self-pay | Admitting: Podiatry

## 2020-03-30 ENCOUNTER — Ambulatory Visit (INDEPENDENT_AMBULATORY_CARE_PROVIDER_SITE_OTHER): Payer: Medicare Other | Admitting: Podiatry

## 2020-03-30 DIAGNOSIS — L84 Corns and callosities: Secondary | ICD-10-CM | POA: Diagnosis not present

## 2020-03-30 DIAGNOSIS — G629 Polyneuropathy, unspecified: Secondary | ICD-10-CM | POA: Insufficient documentation

## 2020-03-30 DIAGNOSIS — M79674 Pain in right toe(s): Secondary | ICD-10-CM | POA: Diagnosis not present

## 2020-03-30 DIAGNOSIS — M79675 Pain in left toe(s): Secondary | ICD-10-CM | POA: Diagnosis not present

## 2020-03-30 DIAGNOSIS — N189 Chronic kidney disease, unspecified: Secondary | ICD-10-CM | POA: Insufficient documentation

## 2020-03-30 DIAGNOSIS — B351 Tinea unguium: Secondary | ICD-10-CM | POA: Diagnosis not present

## 2020-03-30 DIAGNOSIS — G43109 Migraine with aura, not intractable, without status migrainosus: Secondary | ICD-10-CM | POA: Insufficient documentation

## 2020-03-30 DIAGNOSIS — Z8673 Personal history of transient ischemic attack (TIA), and cerebral infarction without residual deficits: Secondary | ICD-10-CM | POA: Insufficient documentation

## 2020-03-30 DIAGNOSIS — E1142 Type 2 diabetes mellitus with diabetic polyneuropathy: Secondary | ICD-10-CM

## 2020-03-30 DIAGNOSIS — Q828 Other specified congenital malformations of skin: Secondary | ICD-10-CM | POA: Diagnosis not present

## 2020-03-30 DIAGNOSIS — Q613 Polycystic kidney, unspecified: Secondary | ICD-10-CM | POA: Insufficient documentation

## 2020-03-30 DIAGNOSIS — G44209 Tension-type headache, unspecified, not intractable: Secondary | ICD-10-CM | POA: Insufficient documentation

## 2020-03-30 DIAGNOSIS — K5901 Slow transit constipation: Secondary | ICD-10-CM | POA: Insufficient documentation

## 2020-03-30 DIAGNOSIS — L209 Atopic dermatitis, unspecified: Secondary | ICD-10-CM | POA: Insufficient documentation

## 2020-03-30 NOTE — Progress Notes (Signed)
Subjective: Richard Shaw presents today for preventative diabetic foot care and painful callus(es) bilaterally and painful thick toenails that are difficult to trim. Painful toenails interfere with ambulation. Aggravating factors include wearing enclosed shoe gear. Pain is relieved with periodic professional debridement. Painful calluses are aggravated when weightbearing with and without shoegear. Pain is relieved with periodic professional debridement.   Richard Shaw states he and his wife are enjoying working out at [employer].  [name], MD is patient's PCP. Last visit was 02/18/2020.  He voices no new pedal concerns on today's visit.  Allergies  Allergen Reactions  . Aspirin     Other reaction(s): stomach burns  . Omnipaque [Iohexol] Swelling    Pt states at [facility] Radiology in [place] on 05/09/2016 he swelled all over after receiving 64mL of Omnipaque 350 concentration.   [name] Penicillamine     Other reaction(s): hallucination  . Penicillins Other (See Comments)    hallucinations  Objective: There were no vitals filed for this visit.  Richard Shaw is a pleasant 70 y.o. African American male, WD, WN in NAD. AAO X 3.  Vascular Examination: Neurovascular status unchanged b/l lower extremities. Capillary refill time to digits immediate b/l. Palpable pedal pulses b/l LE. Pedal hair sparse. Lower extremity skin temperature gradient within normal limits. No pain with calf compression b/l. No edema noted b/l lower extremities. No ischemia or gangrene noted b/l lower extremities.  Dermatological Examination: Pedal skin with normal turgor, texture and tone bilaterally. No open wounds bilaterally. No interdigital macerations bilaterally. Toenails 1-5 b/l elongated, discolored, dystrophic, thickened, crumbly with subungual debris and tenderness to dorsal palpation. Hyperkeratotic lesion(s) L 5th toe.  No erythema, no edema, no drainage, no fluctuance. Porokeratotic lesion(s) submet head 1  right foot, submet head 3 left foot and submet head 5 right foot. No erythema, no edema, no drainage, no fluctuance.  Musculoskeletal Examination: Normal muscle strength 5/5 to all lower extremity muscle groups bilaterally. No pain crepitus or joint limitation noted with ROM b/l. Hallux valgus with bunion deformity noted b/l lower extremities. Hammertoes noted to the 2-5 bilaterally.  Neurological Examination: Pt has subjective symptoms of neuropathy. Protective sensation intact 5/5 intact bilaterally with 10g monofilament b/l. Vibratory sensation intact b/l. Proprioception intact bilaterally. Babinski reflex negative b/l. Clonus negative b/l.  Assessment: 1. Pain due to onychomycosis of toenails of both feet   2. Porokeratosis   3. Corns   4. Diabetic peripheral neuropathy ([place])     Plan: -Examined patient. -Continue diabetic foot care principles. -Toenails 1-5 b/l were debrided in length and girth with sterile nail nippers and dremel without iatrogenic bleeding.  -Corn(s) L 5th toe pared utilizing sterile scalpel blade without complication or incident. Total number debrided=1. -Painful porokeratotic lesion(s) submet head 1 right foot, submet head 3 left foot and submet head 5 right foot pared and enucleated with sterile scalpel blade without incident. -Patient to report any pedal injuries to medical professional immediately.  Return in about 3 months (around 06/27/2020).  Marzetta Board, DPM

## 2020-04-03 DIAGNOSIS — N189 Chronic kidney disease, unspecified: Secondary | ICD-10-CM | POA: Diagnosis not present

## 2020-04-03 DIAGNOSIS — D229 Melanocytic nevi, unspecified: Secondary | ICD-10-CM | POA: Diagnosis not present

## 2020-04-03 DIAGNOSIS — E1169 Type 2 diabetes mellitus with other specified complication: Secondary | ICD-10-CM | POA: Diagnosis not present

## 2020-04-03 DIAGNOSIS — Z8673 Personal history of transient ischemic attack (TIA), and cerebral infarction without residual deficits: Secondary | ICD-10-CM | POA: Diagnosis not present

## 2020-04-03 DIAGNOSIS — H6983 Other specified disorders of Eustachian tube, bilateral: Secondary | ICD-10-CM | POA: Diagnosis not present

## 2020-04-03 DIAGNOSIS — Z7984 Long term (current) use of oral hypoglycemic drugs: Secondary | ICD-10-CM | POA: Diagnosis not present

## 2020-04-03 DIAGNOSIS — Z0000 Encounter for general adult medical examination without abnormal findings: Secondary | ICD-10-CM | POA: Diagnosis not present

## 2020-04-03 DIAGNOSIS — L209 Atopic dermatitis, unspecified: Secondary | ICD-10-CM | POA: Diagnosis not present

## 2020-04-03 DIAGNOSIS — G44209 Tension-type headache, unspecified, not intractable: Secondary | ICD-10-CM | POA: Diagnosis not present

## 2020-04-03 DIAGNOSIS — Z1389 Encounter for screening for other disorder: Secondary | ICD-10-CM | POA: Diagnosis not present

## 2020-04-03 DIAGNOSIS — Q613 Polycystic kidney, unspecified: Secondary | ICD-10-CM | POA: Diagnosis not present

## 2020-04-03 DIAGNOSIS — Z7189 Other specified counseling: Secondary | ICD-10-CM | POA: Diagnosis not present

## 2020-04-03 DIAGNOSIS — G43109 Migraine with aura, not intractable, without status migrainosus: Secondary | ICD-10-CM | POA: Diagnosis not present

## 2020-04-03 DIAGNOSIS — Z125 Encounter for screening for malignant neoplasm of prostate: Secondary | ICD-10-CM | POA: Diagnosis not present

## 2020-05-25 DIAGNOSIS — Z23 Encounter for immunization: Secondary | ICD-10-CM | POA: Diagnosis not present

## 2020-07-08 ENCOUNTER — Encounter: Payer: Self-pay | Admitting: Podiatry

## 2020-07-08 ENCOUNTER — Ambulatory Visit (INDEPENDENT_AMBULATORY_CARE_PROVIDER_SITE_OTHER): Payer: Medicare Other | Admitting: Podiatry

## 2020-07-08 ENCOUNTER — Other Ambulatory Visit: Payer: Self-pay

## 2020-07-08 DIAGNOSIS — E1142 Type 2 diabetes mellitus with diabetic polyneuropathy: Secondary | ICD-10-CM | POA: Diagnosis not present

## 2020-07-08 DIAGNOSIS — B351 Tinea unguium: Secondary | ICD-10-CM | POA: Diagnosis not present

## 2020-07-08 DIAGNOSIS — M79675 Pain in left toe(s): Secondary | ICD-10-CM

## 2020-07-08 DIAGNOSIS — Q828 Other specified congenital malformations of skin: Secondary | ICD-10-CM | POA: Diagnosis not present

## 2020-07-08 DIAGNOSIS — M79674 Pain in right toe(s): Secondary | ICD-10-CM | POA: Diagnosis not present

## 2020-07-08 DIAGNOSIS — L84 Corns and callosities: Secondary | ICD-10-CM

## 2020-07-15 NOTE — Progress Notes (Signed)
Subjective: Richard Shaw is a pleasant 70 y.o. male patient seen today for preventative diabetic foot care. Patient has  painful thick toenails that are difficult to trim. Pain interferes with ambulation. Aggravating factors include wearing enclosed shoe gear. Pain is relieved with periodic professional debridement.  Patient also has calluses b/l feet as well which pose a risk due to his diabetes.  PCP is [name], MD. Last visit was: 02/14/2020.  Allergies  Allergen Reactions   Aspirin     Other reaction(s): stomach burns   Omnipaque [Iohexol] Swelling    Pt states at [facility] Radiology in [place] on 05/09/2016 he swelled all over after receiving 41mL of Omnipaque 350 concentration.    Penicillamine     Other reaction(s): hallucination   Penicillins Other (See Comments)    hallucinations    Objective: Physical Exam  General: General Wearing is a pleasant 70 y.o. African American male, WD, WN in NAD. AAO x 3.   Vascular:  Capillary refill time to digits immediate b/l. Palpable pedal pulses b/l LE. Pedal hair sparse. Lower extremity skin temperature gradient within normal limits. No pain with calf compression b/l. No edema noted b/l lower extremities.  Dermatological:  Pedal skin with normal turgor, texture and tone bilaterally. No open wounds bilaterally. No interdigital macerations bilaterally. Toenails 1-5 b/l elongated, discolored, dystrophic, thickened, crumbly with subungual debris and tenderness to dorsal palpation. Hyperkeratotic lesion(s) L 5th toe.  No erythema, no edema, no drainage, no fluctuance. Porokeratotic lesion(s) submet head 3 left foot and submet head 5 right foot. No erythema, no edema, no drainage, no fluctuance.  Musculoskeletal:  Normal muscle strength 5/5 to all lower extremity muscle groups bilaterally. No pain crepitus or joint limitation noted with ROM b/l. Hallux valgus with bunion deformity noted b/l lower extremities. Hammertoe(s) noted to the  2-5 bilaterally.  Neurological:  Pt has subjective symptoms of neuropathy. Protective sensation intact 5/5 intact bilaterally with 10g monofilament b/l. Vibratory sensation intact b/l.  Assessment and Plan:  1. Pain due to onychomycosis of toenails of both feet   2. Porokeratosis   3. Corns   4. Diabetic peripheral neuropathy ([place])    -Examined patient. -Continue diabetic foot care principles. -Patient to continue soft, supportive shoe gear daily. -Toenails 1-5 b/l were debrided in length and girth with sterile nail nippers and dremel without iatrogenic bleeding.  -Corn(s) L 5th toe pared utilizing sterile scalpel blade without complication or incident. Total number debrided=1. -Painful porokeratotic lesion(s) submet head 3 left foot and submet head 5 right foot pared and enucleated with sterile scalpel blade without incident. Total number of lesions debrided=2. -Patient to report any pedal injuries to medical professional immediately. -Patient/POA to call should there be question/concern in the interim.  Return in about 3 months (around 10/08/2020) for diabetic nail and callus trim.  Marzetta Board, DPM

## 2020-08-24 DIAGNOSIS — Z7984 Long term (current) use of oral hypoglycemic drugs: Secondary | ICD-10-CM | POA: Diagnosis not present

## 2020-08-24 DIAGNOSIS — H6982 Other specified disorders of Eustachian tube, left ear: Secondary | ICD-10-CM | POA: Diagnosis not present

## 2020-08-24 DIAGNOSIS — E1169 Type 2 diabetes mellitus with other specified complication: Secondary | ICD-10-CM | POA: Diagnosis not present

## 2020-09-09 ENCOUNTER — Ambulatory Visit (INDEPENDENT_AMBULATORY_CARE_PROVIDER_SITE_OTHER): Payer: Medicare Other | Admitting: Podiatry

## 2020-09-09 ENCOUNTER — Encounter: Payer: Self-pay | Admitting: Podiatry

## 2020-09-09 ENCOUNTER — Other Ambulatory Visit: Payer: Self-pay

## 2020-09-09 DIAGNOSIS — E1142 Type 2 diabetes mellitus with diabetic polyneuropathy: Secondary | ICD-10-CM

## 2020-09-09 DIAGNOSIS — M79674 Pain in right toe(s): Secondary | ICD-10-CM

## 2020-09-09 DIAGNOSIS — B351 Tinea unguium: Secondary | ICD-10-CM

## 2020-09-09 DIAGNOSIS — M79675 Pain in left toe(s): Secondary | ICD-10-CM

## 2020-09-09 DIAGNOSIS — L84 Corns and callosities: Secondary | ICD-10-CM

## 2020-09-09 DIAGNOSIS — Q828 Other specified congenital malformations of skin: Secondary | ICD-10-CM

## 2020-09-13 NOTE — Progress Notes (Signed)
  Subjective:  Patient ID: Richard Shaw, male    DOB: [birth year],  MRN: [id number]  Chukwudi Allender presents to clinic today for at risk foot care with history of diabetic neuropathy, corn(s) left 5th toe  which interfere(s) with ambulation. Aggravating factors include wearing enclosed shoe gear. Pain is relieved with periodic professional debridement. He also hs painful porokeratotic lesion(s) b/l feet and painful mycotic toenails that limit ambulation. Painful toenails interfere with ambulation. Aggravating factors include wearing enclosed shoe gear. Pain is relieved with periodic professional debridement. Painful porokeratotic lesions are aggravated when weightbearing with and without shoegear. Pain is relieved with periodic professional debridement.  Patient states blood glucose was 133 mg/dl today.  He notes no new pedal problems on today's visit.  PCP is [name], MD , and last visit was 02/14/2020.  Allergies  Allergen Reactions   Aspirin     Other reaction(s): stomach burns   Omnipaque [Iohexol] Swelling    Pt states at [facility] Radiology in [place] on 05/09/2016 he swelled all over after receiving 37m of Omnipaque 350 concentration.    Penicillamine     Other reaction(s): hallucination   Penicillins Other (See Comments)    hallucinations    Review of Systems: Negative except as noted in the HPI. Objective:   Constitutional WRhonald Saelensis a pleasant 70y.o. African American male, WD, WN in NAD. AAO x 3.   Vascular Capillary refill time to digits immediate b/l. Palpable DP pulse(s) b/l lower extremities Palpable PT pulse(s) b/l lower extremities Pedal hair sparse. Lower extremity skin temperature gradient within normal limits. No pain with calf compression b/l. No edema noted b/l lower extremities. No cyanosis or clubbing noted.  Neurologic Normal speech. Oriented to person, place, and time. Pt has subjective symptoms of neuropathy. Protective sensation intact 5/5 intact  bilaterally with 10g monofilament b/l. Vibratory sensation intact b/l.  Dermatologic Skin warm and supple b/l lower extremities. No open wounds b/l lower extremities. No interdigital macerations b/l lower extremities. Toenails 1-5 b/l elongated, discolored, dystrophic, thickened, crumbly with subungual debris and tenderness to dorsal palpation. Hyperkeratotic lesion(s) L 5th toe.  No erythema, no edema, no drainage, no fluctuance. Porokeratotic lesion(s) submet head 3 left foot and submet head 5 right foot. No erythema, no edema, no drainage, no fluctuance.  Orthopedic: Normal muscle strength 5/5 to all lower extremity muscle groups bilaterally. No pain crepitus or joint limitation noted with ROM b/l lower extremities. Hallux valgus with bunion deformity noted b/l lower extremities. Hammertoe(s) noted to the 2-5 bilaterally.   Radiographs: None Assessment:   1. Pain due to onychomycosis of toenails of both feet   2. Porokeratosis   3. Corns   4. Diabetic peripheral neuropathy ([place]    Plan:  -Examined patient. -Continue diabetic foot care principles: inspect feet daily, monitor glucose as recommended by PCP and/or Endocrinologist, and follow prescribed diet per PCP, Endocrinologist and/or dietician. -Patient to continue soft, supportive shoe gear daily. -Toenails 1-5 b/l were debrided in length and girth with sterile nail nippers and dremel without iatrogenic bleeding.  -Corn(s) L 5th toe pared utilizing sterile scalpel blade without complication or incident. Total number debrided=1. -Painful porokeratotic lesion(s) submet head 3 left foot and submet head 5 right foot pared and enucleated with sterile scalpel blade without incident. Total number of lesions debrided=2. -Patient to report any pedal injuries to medical professional immediately. -Patient/POA to call should there be question/concern in the interim.  Return in about 3 months (around 12/10/2020).  JMarzetta Board DPM

## 2020-10-02 DIAGNOSIS — U071 COVID-19: Secondary | ICD-10-CM | POA: Diagnosis not present

## 2020-10-07 DIAGNOSIS — Z20822 Contact with and (suspected) exposure to covid-19: Secondary | ICD-10-CM | POA: Diagnosis not present

## 2020-10-28 DIAGNOSIS — Z23 Encounter for immunization: Secondary | ICD-10-CM | POA: Diagnosis not present

## 2020-10-29 DIAGNOSIS — Z7984 Long term (current) use of oral hypoglycemic drugs: Secondary | ICD-10-CM | POA: Diagnosis not present

## 2020-10-29 DIAGNOSIS — Z8673 Personal history of transient ischemic attack (TIA), and cerebral infarction without residual deficits: Secondary | ICD-10-CM | POA: Diagnosis not present

## 2020-10-29 DIAGNOSIS — E1169 Type 2 diabetes mellitus with other specified complication: Secondary | ICD-10-CM | POA: Diagnosis not present

## 2020-11-11 ENCOUNTER — Other Ambulatory Visit: Payer: Self-pay

## 2020-11-11 ENCOUNTER — Ambulatory Visit (INDEPENDENT_AMBULATORY_CARE_PROVIDER_SITE_OTHER): Payer: Medicare Other | Admitting: Podiatry

## 2020-11-11 ENCOUNTER — Encounter: Payer: Self-pay | Admitting: Podiatry

## 2020-11-11 DIAGNOSIS — M79674 Pain in right toe(s): Secondary | ICD-10-CM | POA: Diagnosis not present

## 2020-11-11 DIAGNOSIS — B351 Tinea unguium: Secondary | ICD-10-CM

## 2020-11-11 DIAGNOSIS — Q828 Other specified congenital malformations of skin: Secondary | ICD-10-CM

## 2020-11-11 DIAGNOSIS — M79675 Pain in left toe(s): Secondary | ICD-10-CM | POA: Diagnosis not present

## 2020-11-11 DIAGNOSIS — L84 Corns and callosities: Secondary | ICD-10-CM | POA: Diagnosis not present

## 2020-11-11 DIAGNOSIS — E1142 Type 2 diabetes mellitus with diabetic polyneuropathy: Secondary | ICD-10-CM

## 2020-11-15 NOTE — Progress Notes (Signed)
Subjective:  Patient ID: Richard Shaw, male    DOB: [birth year],  MRN: [id number]  Richard Shaw presents to clinic today for at risk foot care with history of diabetic neuropathy, corn(s) left 5th toe  which interfere(s) with ambulation. Aggravating factors include wearing enclosed shoe gear. Pain is relieved with periodic professional debridement. He also hs painful porokeratotic lesion(s) b/l feet and painful mycotic toenails that limit ambulation. Painful toenails interfere with ambulation. Aggravating factors include wearing enclosed shoe gear. Pain is relieved with periodic professional debridement. Painful porokeratotic lesions are aggravated when weightbearing with and without shoegear. Pain is relieved with periodic professional debridement.  Patient states he has not checked his blood glucose on today.   He notes no new pedal problems on today's visit.  PCP is [name], MD , and last visit was 04/03/2020  Allergies  Allergen Reactions   Aspirin     Other reaction(s): stomach burns   Omnipaque [Iohexol] Swelling    Pt states at [facility]iology in [place] on 05/09/2016 he swelled all over after receiving 54mL of Omnipaque 350 concentration.    Penicillamine     Other reaction(s): hallucination   Penicillins Other (See Comments)    hallucinations    Review of Systems: Negative except as noted in the HPI. Objective:   Constitutional Richard Shaw is a pleasant 70 y.o. African American male, WD, WN in NAD. AAO x 3.   Vascular Capillary refill time to digits immediate b/l. Palpable DP pulse(s) b/l lower extremities Palpable PT pulse(s) b/l lower extremities Pedal hair sparse. Lower extremity skin temperature gradient within normal limits. No pain with calf compression b/l. No edema noted b/l lower extremities. No cyanosis or clubbing noted.  Neurologic Normal speech. Oriented to person, place, and time. Pt has subjective symptoms of neuropathy. Protective sensation  intact 5/5 intact bilaterally with 10g monofilament b/l. Vibratory sensation intact b/l.  Dermatologic Skin warm and supple b/l lower extremities. No open wounds b/l lower extremities. No interdigital macerations b/l lower extremities. Toenails 1-5 b/l elongated, discolored, dystrophic, thickened, crumbly with subungual debris and tenderness to dorsal palpation. Hyperkeratotic lesion(s) L 5th toe.  No erythema, no edema, no drainage, no fluctuance. Porokeratotic lesion(s) submet head 3 left foot and submet head 5 right foot. No erythema, no edema, no drainage, no fluctuance.  Orthopedic: Normal muscle strength 5/5 to all lower extremity muscle groups bilaterally. No pain crepitus or joint limitation noted with ROM b/l lower extremities. Hallux valgus with bunion deformity noted b/l lower extremities. Hammertoe(s) noted to the 2-5 bilaterally.   Radiographs: None Assessment:   1. Pain due to onychomycosis of toenails of both feet   2. Porokeratosis   3. Corns   4. Diabetic peripheral neuropathy ([place])    Plan:  -No new findings. No new orders. -Continue diabetic foot care principles: inspect feet daily, monitor glucose as recommended by PCP and/or Endocrinologist, and follow prescribed diet per PCP, Endocrinologist and/or dietician. -Patient to continue soft, supportive shoe gear daily. -Toenails 1-5 b/l were debrided in length and girth with sterile nail nippers and dremel without iatrogenic bleeding.  -Corn(s) L 5th toe pared utilizing sterile scalpel blade without complication or incident. Total number debrided=1. -Painful porokeratotic lesion(s) submet head 3 left foot and submet head 5 right foot pared and enucleated with sterile scalpel blade without incident. Total number of lesions debrided=2. -Patient to report any pedal injuries to medical professional immediately. -Patient/POA to call should there be question/concern in the interim.  Return in about 3 months (around  02/11/2021).  Marzetta Board, DPM

## 2021-02-22 ENCOUNTER — Ambulatory Visit (INDEPENDENT_AMBULATORY_CARE_PROVIDER_SITE_OTHER): Payer: Medicare Other | Admitting: Podiatry

## 2021-02-22 ENCOUNTER — Other Ambulatory Visit: Payer: Self-pay

## 2021-02-22 DIAGNOSIS — Q828 Other specified congenital malformations of skin: Secondary | ICD-10-CM | POA: Diagnosis not present

## 2021-02-22 DIAGNOSIS — I739 Peripheral vascular disease, unspecified: Secondary | ICD-10-CM

## 2021-02-22 DIAGNOSIS — B351 Tinea unguium: Secondary | ICD-10-CM

## 2021-02-22 DIAGNOSIS — L84 Corns and callosities: Secondary | ICD-10-CM

## 2021-02-22 DIAGNOSIS — E119 Type 2 diabetes mellitus without complications: Secondary | ICD-10-CM

## 2021-02-22 DIAGNOSIS — M79675 Pain in left toe(s): Secondary | ICD-10-CM | POA: Diagnosis not present

## 2021-02-22 DIAGNOSIS — E1142 Type 2 diabetes mellitus with diabetic polyneuropathy: Secondary | ICD-10-CM | POA: Diagnosis not present

## 2021-02-22 DIAGNOSIS — M79674 Pain in right toe(s): Secondary | ICD-10-CM

## 2021-02-22 NOTE — Progress Notes (Signed)
ANNUAL DIABETIC FOOT EXAM  Subjective: Richard Shaw presents today for for annual diabetic foot examination.  Patient relates h/o diabetes.  Patient denies any h/o foot wounds.  He does relate cramping in calves at times when walking.  Patient has been diagnosed with neuropathy and it is managed with gabapentin.  Patient's blood sugar was 133 mg/dl today.   Risk factors: diabetes, diabetic neuropathy, intermittent claudication, CKD, hyperlipidemia, h/o TIA.  [name], MD is patient's PCP. Last visit was 02/11/2021.  Past Medical History:  Diagnosis Date   Chronic kidney disease    Diabetes mellitus without complication ([place])    Hepatitis C infection 1969   Herpes simplex    History of CVA (cerebrovascular accident) 07/2017   Hyperlipidemia    Neck pain    Tension headache    Patient Active Problem List   Diagnosis Date Noted   Atopic dermatitis 03/30/2020   Chronic renal failure syndrome 03/30/2020   Migraine aura without headache 03/30/2020   Neuropathy 03/30/2020   Personal history of transient ischemic attack (TIA), and cerebral infarction without residual deficits 03/30/2020   Polycystic kidney disease 03/30/2020   Slow transit constipation 03/30/2020   Tension-type headache 03/30/2020   Diabetic peripheral neuropathy ([place]) 05/27/2019   Neck pain 05/27/2019   DM (diabetes mellitus) ([place]) 01/30/2018   Herpes 01/30/2018   Herpesvirus infection 03/15/2017   Past Surgical History:  Procedure Laterality Date   None     Current Outpatient Medications on File Prior to Visit  Medication Sig Dispense Refill   BINAXNOW COVID-19 AG HOME TEST KIT TEST AS DIRECTED TODAY     clotrimazole-betamethasone (LOTRISONE) cream 1 application     dapagliflozin propanediol (FARXIGA) 10 MG TABS tablet 1 tablet     desloratadine (CLARINEX) 5 MG tablet Take 5 mg by mouth daily as needed.      EPINEPHrine 0.3 mg/0.3 mL IJ SOAJ injection Inject 0.3 mg into the muscle as  needed.      famciclovir (FAMVIR) 500 MG tablet Take 500 mg by mouth daily as needed.      gabapentin (NEURONTIN) 100 MG capsule Take 100 mg by mouth daily as needed.     gabapentin (NEURONTIN) 300 MG capsule Take 300 mg by mouth as needed.      glipiZIDE (GLUCOTROL) 10 MG tablet 1 tablet     Glucos-Chond-Hyal Ac-Ca Fructo (MOVE FREE JOINT HEALTH ADVANCE) TABS See admin instructions.     glucose blood (CONTOUR NEXT TEST) test strip See admin instructions.     hydrOXYzine (ATARAX/VISTARIL) 25 MG tablet 1 tablet as needed     JANUVIA 100 MG tablet      loratadine (CLARITIN) 10 MG tablet Take 10 mg by mouth daily.     metFORMIN (GLUCOPHAGE) 1000 MG tablet 1 tablet with a meal     metFORMIN (GLUMETZA) 1000 MG (MOD) 24 hr tablet metformin ER 1,000 mg 24 hr tablet,extended release     Microlet Lancets MISC See admin instructions.     Omega-3 Fatty Acids (FISH OIL) 1000 MG CAPS 1 capsule     PAXLOVID, 300/100, 20 x 150 MG & 10 x 100MG TBPK See admin instructions.     SHINGRIX injection      simvastatin (ZOCOR) 10 MG tablet      triamcinolone cream (KENALOG) 0.5 % APP EXT AA BID FOR 1 WK     No current facility-administered medications on file prior to visit.    Allergies  Allergen Reactions   Aspirin  Other reaction(s): stomach burns   Omnipaque [Iohexol] Swelling    Pt states at [facility] in [place] on 05/09/2016 he swelled all over after receiving 2m of Omnipaque 350 concentration.    Penicillamine     Other reaction(s): hallucination   Penicillins Other (See Comments)    hallucinations   Social History   Occupational History   Occupation: Retired  Tobacco Use   Smoking status: Former    Types: Cigarettes   Smokeless tobacco: Former  Substance and Sexual Activity   Alcohol use: Not Currently   Drug use: Not Currently   Sexual activity: Not on file   Family History  Problem Relation Age of Onset   Breast cancer Mother    Stomach cancer Father    Heart disease  Father     There is no immunization history on file for this patient.   Review of Systems: Negative except as noted in the HPI.   Objective: There were no vitals filed for this visit.  WTreson Laurais a pleasant 71y.o. male in NAD. AAO X 3.  Vascular Examination: CFT <3 seconds b/l LE. Palpable pedal pulses b/l LE. Pedal hair sparse. No pain with calf compression b/l. No edema noted b/l LE. No cyanosis or clubbing noted b/l LE.  Dermatological Examination: Pedal integument with normal turgor, texture and tone BLE. Toenails 1-5 b/l elongated, discolored, dystrophic, thickened, crumbly with subungual debris and tenderness to dorsal palpation. Hyperkeratotic lesion(s) L 5th toe.  No erythema, no edema, no drainage, no fluctuance. Porokeratotic lesion(s) submet head 3 left foot and submet head 5 right foot. No erythema, no edema, no drainage, no fluctuance.  Musculoskeletal Examination: Normal muscle strength 5/5 to all lower extremity muscle groups bilaterally. HAV with bunion deformity noted b/l LE. Hammertoe deformity noted 2-5 b/l..Marland KitchenNo pain, crepitus or joint limitation noted with ROM b/l LE.  Patient ambulates independently without assistive aids.  Footwear Assessment: Does the patient wear appropriate shoes? Yes. Does the patient need inserts/orthotics? Yes.  Neurological Examination: Pt has subjective symptoms of neuropathy. Protective sensation intact 5/5 intact bilaterally with 10g monofilament b/l. Vibratory sensation intact b/l.  Assessment: 1. Pain due to onychomycosis of toenails of both feet   2. Intermittent claudication ([place]   3. Corns   4. Porokeratosis   5. Diabetic peripheral neuropathy associated with type 2 diabetes mellitus ([place]   6. Encounter for diabetic foot exam ([place]     ADA Risk Categorization: Low Risk :  Patient has all of the following: Intact protective sensation No prior foot ulcer  No severe deformity Pedal pulses present  Plan: -Diabetic  foot examination performed today. -Continue foot and shoe inspections daily. Monitor blood glucose per PCP/Endocrinologist's recommendations. -Based on risk factors above and symptoms of intermittent claudication, ABIs with TBIs ordered. -Mycotic toenails 1-5 bilaterally were debrided in length and girth with sterile nail nippers and dremel without incident. -Corn(s) L 5th toe pared utilizing sterile scalpel blade without complication or incident. Total number debrided=1. -Painful porokeratotic lesion(s) submet head 3 left foot and submet head 5 right foot pared and enucleated with sterile scalpel blade without incident. Total number of lesions debrided=2. -Patient/POA to call should there be question/concern in the interim.  Return in about 3 months (around 05/23/2021).  JMarzetta Board DPM

## 2021-02-22 NOTE — Patient Instructions (Signed)
Intermittent Claudication Intermittent claudication is pain in one leg or both legs that occurs when walking or exercising and goes away when resting. This condition is a symptom of peripheral vascular disease (PVD). PVD is a disease of the blood vessels. This condition is commonly treated with physical activity, medicine, and lifestyle changes. If medical management does not improve symptoms, surgery may be done to restore blood flow. This surgery is called revascularization. What are the causes? This condition is caused by a buildup of fatty material and other substances (plaque) within the arteries (atherosclerosis). Plaque makes arteries stiff and narrow, preventing proper blood flow to the leg muscles. Pain occurs when you walk or exercise because your muscles need more blood when you are moving and exercising but cannot get it because of poor blood flow. What increases the risk? The following factors may make you more likely to develop this condition: Smoking cigarettes. A personal history of stroke or heart disease. Age. The older you are, the higher the risk. Being inactive (sedentary lifestyle) or being overweight. A family history of atherosclerosis. Having another health condition, such as: Diabetes. High blood pressure. High cholesterol. What are the signs or symptoms? Symptoms of this condition can be in one leg or both legs, and can occur in the feet, calf, thigh, hip, or buttock over time. Symptoms may include: Aches or pains when walking. Cramps. A feeling of tightness, weakness, or heaviness. A wound on the lower leg or foot that heals poorly or does not heal. How is this diagnosed? This condition may be diagnosed based on: Your symptoms. Your medical history. A physical exam. Tests, such as: Ankle-brachial index (ABI) to check blood pressure in the legs and compare it to the pressure in the arms. Exercise ankle-brachial test. For this test, you walk on a treadmill. Tests  are done to check how the condition affects your ability to walk or exercise. Arterial duplex ultrasound to view how blood flows within arteries. CT angiogram (CTA). An X-ray machine is used to take pictures of the blood vessels after dye is injected. Magnetic resonance angiogram (MRA). This creates images of blood vessels and blood flow within them. Angiogram. In this procedure, dye is injected into arteries and then X-rays are taken. Blood tests. How is this treated? Treatment for this condition involves treating the underlying cause and managing risk factors, such as high blood pressure, high cholesterol, or diabetes. Treatment may include: Lifestyle changes, such as: Starting a supervised or home-based exercise program. Losing weight. Quitting smoking. Medicines to help restore blood flow through your legs. If you have symptoms that affect your everyday activities, or have a wound that is not healing, treatment may include: Angioplasty to open a blocked artery using an inflated balloon. Stent implant to open a blocked artery using a mesh-like tube. Surgery to restore blood flow by creating a bypass around a blocked area. Follow these instructions at home: Lifestyle  Maintain a healthy weight. Eat a diet that is low in saturated fats and calories. Consider working with a dietitianto help you make healthy food choices. Do not use any products that contain nicotine or tobacco. These products include cigarettes, chewing tobacco, and vaping devices, such as e-cigarettes. If you need help quitting, ask your health care provider. If your health care provider recommended an exercise program for you, follow it as directed. Your exercise program may involve: Walking three or more times a week. Walking until you have certain symptoms of intermittent claudication, resting until your symptoms go away,  and then resuming your walk. Gradually increasing your total walking time to about 50 minutes a  day. General instructions Work with your health care provider to manage other health conditions that may increase your risk, including diabetes, high blood pressure, or high cholesterol. Take over-the-counter and prescription medicines only as told by your health care provider. Keep all follow-up visits. This is important. Contact a health care provider if: Your pain does not go away with rest. You have sores on your legs that do not heal or have pus or a bad smell. Your condition gets worse or does not improve with treatment. Get help right away if: You have chest pain. You have trouble breathing. Your foot or leg is cold or it changes color. Your foot or leg becomes numb. You have any symptoms of a stroke. "BE FAST" is an easy way to remember the main warning signs of a stroke: B - Balance. Signs are dizziness, sudden trouble walking, or loss of balance. E - Eyes. Signs are trouble seeing or a sudden change in vision. F - Face. Signs are sudden weakness or numbness of the face, or the face or eyelid drooping on one side. A - Arms. Signs are weakness or numbness in an arm. This happens suddenly and usually on one side of the body. S - Speech. Signs are sudden trouble speaking, slurred speech, or trouble understanding what people say. T - Time. Time to call emergency services. Write down what time symptoms started. You have other signs of a stroke, such as: A sudden, severe headache with no known cause. Nausea or vomiting. Seizure. These symptoms may represent a serious problem that is an emergency. Do not wait to see if the symptoms resolve. Get medical help right away. Call your local emergency services (911 in the [place]). Do not drive yourself to the hospital.  Summary Intermittent claudication is pain in the leg or legs that occurs when walking or exercising and goes away when resting. This condition is caused by a buildup of plaque in the arteries. Plaque makes arteries stiff and  narrow, which prevents proper blood flow to the leg. Intermittent claudication can be treated with medicine and lifestyle changes. If these fail, surgery may be done to restore blood flow to the affected area. Work with your health care provider to manage other health conditions you may have, including diabetes, high blood pressure, or high cholesterol. This information is not intended to replace advice given to you by your health care provider. Make sure you discuss any questions you have with your health care provider. Document Revised: 07/22/2019 Document Reviewed: 07/22/2019 Elsevier Patient Education  [place].  Diabetic Neuropathy Diabetic neuropathy refers to nerve damage that is caused by diabetes. Over time, people with diabetes can develop nerve damage throughout the body. There are several types of diabetic neuropathy: Peripheral neuropathy. This is the most common type of diabetic neuropathy. It damages the nerves that carry signals between the spinal cord and other parts of the body (peripheral nerves). This usually affects nerves in the feet, legs, hands, and arms. Autonomic neuropathy. This type causes damage to nerves that control involuntary functions (autonomic nerves). Involuntary functions are functions of the body that you do not control. They include heartbeat, body temperature, blood pressure, urination, digestion, sweating, sexual function, or response to changes in blood glucose. Focal neuropathy. This type of nerve damage affects one area of the body, such as an arm, a leg, or the face. The injury may involve  one nerve or a small group of nerves. Focal neuropathy can be painful and unpredictable. It occurs most often in older adults with diabetes. This often develops suddenly, but usually improves over time and does not cause long-term problems. Proximal neuropathy. This type of nerve damage affects the nerves of the thighs, hips, buttocks, or legs. It causes severe  pain, weakness, and muscle death (atrophy), usually in the thigh muscles. It is more common among older men and people who have type 2 diabetes. The length of recovery time may vary. What are the causes? Peripheral, autonomic, and focal neuropathies are caused by diabetes that is not well controlled with treatment. The cause of proximal neuropathy is not known, but it may be caused by inflammation related to uncontrolled blood glucose levels. What are the signs or symptoms? Peripheral neuropathy Peripheral neuropathy develops slowly over time. When the nerves of the feet and legs no longer work, you may experience: Burning, stabbing, or aching pain in the legs or feet. Pain or cramping in the legs or feet. Loss of feeling (numbness) and inability to feel pressure or pain in the feet. This can lead to: Thick calluses or sores on areas of constant pressure. Ulcers. Reduced ability to feel temperature changes. Foot deformities. Muscle weakness. Loss of balance or coordination. Autonomic neuropathy The symptoms of autonomic neuropathy vary depending on which nerves are affected. Symptoms may include: Problems with digestion, such as: Nausea or vomiting. Poor appetite. Bloating. Diarrhea or constipation. Trouble swallowing. Losing weight without trying to. Problems with the heart, blood, and lungs, such as: Dizziness, especially when standing up. Fainting. Shortness of breath. Irregular heartbeat. Bladder problems, such as: Trouble starting or stopping urination. Leaking urine. Trouble emptying the bladder. Urinary tract infections (UTIs). Problems with other body functions, such as: Sweat. You may sweat too much or too little. Temperature. You might get hot easily. Or, you might feel cold more than usual. Sexual function. Men may not be able to get or maintain an erection. Women may have vaginal dryness and difficulty with arousal. Focal neuropathy Symptoms affect only one area of  the body. Common symptoms include: Numbness. Tingling. Burning pain. Prickling feeling. Very sensitive skin. Weakness. Inability to move (paralysis). Muscle twitching. Muscles getting smaller (wasting). Poor coordination. Double or blurred vision. Proximal neuropathy Sudden, severe pain in the hip, thigh, or buttocks. Pain may spread from the back into the legs (sciatica). Pain and numbness in the arms and legs. Tingling. Loss of bladder control or bowel control. Weakness and wasting of thigh muscles. Difficulty getting up from a seated position. Abdominal swelling. Unexplained weight loss. How is this diagnosed? Diagnosis varies depending on the type of neuropathy your health care provider suspects. Peripheral neuropathy Your health care provider will do a neurologic exam. This exam checks your reflexes, how you move, and what you can feel. You may have other tests, such as: Blood tests. Tests of the fluid that surrounds the spinal cord (lumbar puncture). CT scan. MRI. Checking the nerves that control muscles (electromyogram, or EMG). Checking how quickly signals pass through your nerves (nerve conduction study). Checking a small piece of a nerve using a microscope (biopsy). Autonomic neuropathy You may have tests, such as: Tests to measure your blood pressure and heart rate. You may be secured to an exam table that moves you from a lying position to an upright position (table tilt test). Breathing tests to check your lungs. Tests to check how food moves through the digestive system (gastric emptying tests). Blood,  sweat, or urine tests. Ultrasound of your bladder. Spinal fluid tests. Focal neuropathy This condition may be diagnosed with: A neurologic exam. CT scan. MRI. EMG. Nerve conduction study. Proximal neuropathy There is no test to diagnose this type of neuropathy. You may have tests to rule out other possible causes of this type of neuropathy. Tests may  include: X-rays of your spine and lumbar region. Lumbar puncture. MRI. How is this treated? The goal of treatment is to keep nerve damage from getting worse. Treatment may include: Following your diabetes management plan. This will help keep your blood glucose level and your A1C level within your target range. This is the most important treatment. Using prescription pain medicine. Follow these instructions at home: Diabetes management Follow your diabetes management plan as told by your health care provider. Check your blood glucose levels. Keep your blood glucose in your target range. Have your A1C level checked at least two times a year, or as often as told. Take over the counter and prescription medicines only as told by your health care provider. This includes insulin and diabetes medicine.  Lifestyle  Do not use any products that contain nicotine or tobacco, such as cigarettes, e-cigarettes, and chewing tobacco. If you need help quitting, ask your health care provider. Be physically active every day. Include strength training and balance exercises. Follow a healthy meal plan. Work with your health care provider to manage your blood pressure. General instructions Ask your health care provider if the medicine prescribed to you requires you to avoid driving or using machinery. Check your skin and feet every day for cuts, bruises, redness, blisters, or sores. Keep all follow-up visits. This is important. Contact a health care provider if: You have burning, stabbing, or aching pain in your legs or feet. You are unable to feel pressure or pain in your feet. You develop problems with digestion, such as: Nausea. Vomiting. Bloating. Constipation. Diarrhea. Abdominal pain. You have difficulty with urination, such as: Inability to control when you urinate (incontinence). Inability to completely empty the bladder (retention). You feel as if your heart is racing (palpitations). You  feel dizzy, weak, or faint when you stand up. Get help right away if: You cannot urinate. You have sudden weakness or loss of coordination. You have trouble speaking. You have pain or pressure in your chest. You have an irregular heartbeat. You have sudden inability to move a part of your body. These symptoms may represent a serious problem that is an emergency. Do not wait to see if the symptoms will go away. Get medical help right away. Call your local emergency services (911 in the [place] Do not drive yourself to the hospital. Summary Diabetic neuropathy is nerve damage that is caused by diabetes. It can cause numbness and pain in the arms, legs, digestive tract, heart, and other body systems. This condition is treated by keeping your blood glucose level and your A1C level within your target range. This can help prevent neuropathy from getting worse. Check your skin and feet every day for cuts, bruises, redness, blisters, or sores. Do not use any products that contain nicotine or tobacco, such as cigarettes, e-cigarettes, and chewing tobacco. This information is not intended to replace advice given to you by your health care provider. Make sure you discuss any questions you have with your health care provider. Document Revised: 05/30/2019 Document Reviewed: 05/30/2019 Elsevier Patient Education  2022 [place].  Diabetes Mellitus and [place] care is an important part of your  health, especially when you have diabetes. Diabetes may cause you to have problems because of poor blood flow (circulation) to your feet and legs, which can cause your skin to: Become thinner and drier. Break more easily. Heal more slowly. Peel and crack. You may also have nerve damage (neuropathy) in your legs and feet, causing decreased feeling in them. This means that you may not notice minor injuries to your feet that could lead to more serious problems. Noticing and addressing any potential problems  early is the best way to prevent future foot problems. How to care for your feet Foot hygiene  Wash your feet daily with warm water and mild soap. Do not use hot water. Then, pat your feet and the areas between your toes until they are completely dry. Do not soak your feet as this can dry your skin. Trim your toenails straight across. Do not dig under them or around the cuticle. File the edges of your nails with an emery board or nail file. Apply a moisturizing lotion or petroleum jelly to the skin on your feet and to dry, brittle toenails. Use lotion that does not contain alcohol and is unscented. Do not apply lotion between your toes. Shoes and socks Wear clean socks or stockings every day. Make sure they are not too tight. Do not wear knee-high stockings since they may decrease blood flow to your legs. Wear shoes that fit properly and have enough cushioning. Always look in your shoes before you put them on to be sure there are no objects inside. To break in new shoes, wear them for just a few hours a day. This prevents injuries on your feet. Wounds, scrapes, corns, and calluses  Check your feet daily for blisters, cuts, bruises, sores, and redness. If you cannot see the bottom of your feet, use a mirror or ask someone for help. Do not cut corns or calluses or try to remove them with medicine. If you find a minor scrape, cut, or break in the skin on your feet, keep it and the skin around it clean and dry. You may clean these areas with mild soap and water. Do not clean the area with peroxide, alcohol, or iodine. If you have a wound, scrape, corn, or callus on your foot, look at it several times a day to make sure it is healing and not infected. Check for: Redness, swelling, or pain. Fluid or blood. Warmth. Pus or a bad smell. General tips Do not cross your legs. This may decrease blood flow to your feet. Do not use heating pads or hot water bottles on your feet. They may burn your skin. If  you have lost feeling in your feet or legs, you may not know this is happening until it is too late. Protect your feet from hot and cold by wearing shoes, such as at the beach or on hot pavement. Schedule a complete foot exam at least once a year (annually) or more often if you have foot problems. Report any cuts, sores, or bruises to your health care provider immediately. Where to find more information American Diabetes Association: www.diabetes.org Association of Diabetes Care & Education Specialists: www.diabeteseducator.org Contact a health care provider if: You have a medical condition that increases your risk of infection and you have any cuts, sores, or bruises on your feet. You have an injury that is not healing. You have redness on your legs or feet. You feel burning or tingling in your legs or feet. You have pain  or cramps in your legs and feet. Your legs or feet are numb. Your feet always feel cold. You have pain around any toenails. Get help right away if: You have a wound, scrape, corn, or callus on your foot and: You have pain, swelling, or redness that gets worse. You have fluid or blood coming from the wound, scrape, corn, or callus. Your wound, scrape, corn, or callus feels warm to the touch. You have pus or a bad smell coming from the wound, scrape, corn, or callus. You have a fever. You have a red line going up your leg. Summary Check your feet every day for blisters, cuts, bruises, sores, and redness. Apply a moisturizing lotion or petroleum jelly to the skin on your feet and to dry, brittle toenails. Wear shoes that fit properly and have enough cushioning. If you have foot problems, report any cuts, sores, or bruises to your health care provider immediately. Schedule a complete foot exam at least once a year (annually) or more often if you have foot problems. This information is not intended to replace advice given to you by your health care provider. Make sure you  discuss any questions you have with your health care provider. Document Revised: 08/08/2019 Document Reviewed: 08/08/2019 Elsevier Patient Education  [place].

## 2021-02-28 ENCOUNTER — Encounter: Payer: Self-pay | Admitting: Podiatry

## 2021-03-15 ENCOUNTER — Other Ambulatory Visit (INDEPENDENT_AMBULATORY_CARE_PROVIDER_SITE_OTHER): Payer: Medicare Other | Admitting: Podiatry

## 2021-03-15 ENCOUNTER — Ambulatory Visit (HOSPITAL_COMMUNITY)
Admission: RE | Admit: 2021-03-15 | Discharge: 2021-03-15 | Disposition: A | Discharge status: Home / Self Care | Payer: Medicare Other | Source: Ambulatory Visit | Attending: Podiatry | Admitting: Podiatry

## 2021-03-15 ENCOUNTER — Other Ambulatory Visit: Payer: Self-pay | Admitting: Podiatry

## 2021-03-15 ENCOUNTER — Other Ambulatory Visit: Payer: Self-pay

## 2021-03-15 DIAGNOSIS — E1142 Type 2 diabetes mellitus with diabetic polyneuropathy: Secondary | ICD-10-CM

## 2021-03-15 DIAGNOSIS — I739 Peripheral vascular disease, unspecified: Secondary | ICD-10-CM

## 2021-03-15 DIAGNOSIS — R6889 Other general symptoms and signs: Secondary | ICD-10-CM

## 2021-03-15 NOTE — Progress Notes (Signed)
Order written for referral to Vascular Surgery  for 71 y.o. male with h/o diabetes, hyperlipidemia. Former smoker. Has symptoms of claudication. Abnormal ABI/TBI studies. Referral sent to [facility] office, Dr. Servando Snare or first available MD.

## 2021-03-22 DIAGNOSIS — A6002 Herpesviral infection of other male genital organs: Secondary | ICD-10-CM | POA: Diagnosis not present

## 2021-03-22 DIAGNOSIS — E1169 Type 2 diabetes mellitus with other specified complication: Secondary | ICD-10-CM | POA: Diagnosis not present

## 2021-03-22 DIAGNOSIS — Z7984 Long term (current) use of oral hypoglycemic drugs: Secondary | ICD-10-CM | POA: Diagnosis not present

## 2021-03-31 ENCOUNTER — Other Ambulatory Visit: Payer: Self-pay

## 2021-03-31 ENCOUNTER — Ambulatory Visit (INDEPENDENT_AMBULATORY_CARE_PROVIDER_SITE_OTHER): Payer: Medicare Other | Admitting: Vascular Surgery

## 2021-03-31 ENCOUNTER — Encounter: Payer: Self-pay | Admitting: Vascular Surgery

## 2021-03-31 VITALS — BP 119/75 | HR 89 | Temp 98.2°F | Resp 20 | Ht 69.0 in | Wt 176.0 lb

## 2021-03-31 DIAGNOSIS — I70211 Atherosclerosis of native arteries of extremities with intermittent claudication, right leg: Secondary | ICD-10-CM

## 2021-03-31 NOTE — Progress Notes (Signed)
? ?Patient ID: Richard Shaw, male   DOB: Apr 13, 1950, 71 y.o.   MRN: [id number] ? ?Reason for Consult: New Patient (Initial Visit) ?  ?Referred by [name],* ? ?Subjective:  ?   ?HPI: ? ?Richard Shaw is a 71 y.o. male with previous history of smoking and at least 20 years of diabetes.  He previously took an aspirin does not take 1 now.  He does take a statin.  States that recently he has approximately quarter mile claudication.  He has classic calf cramping at this level and he is able to walk again after about 3 minutes of waiting time.  He has no tissue loss or ulceration.  He does have a history of stroke but does not have any history of coronary artery disease or other peripheral vascular diseases.  His father did have coronary artery disease.  He does not have any personal or family history of aneurysm disease. ? ?Past Medical History:  ?Diagnosis Date  ? Chronic kidney disease   ? Diabetes mellitus without complication ([place])   ? Hepatitis C infection 1969  ? Herpes simplex   ? History of CVA (cerebrovascular accident) 07/2017  ? Hyperlipidemia   ? Neck pain   ? Tension headache   ? ?Family History  ?Problem Relation Age of Onset  ? Breast cancer Mother   ? Stomach cancer Father   ? Heart disease Father   ? ?Past Surgical History:  ?Procedure Laterality Date  ? None    ? ? ?Short Social History:  ?Social History  ? ?Tobacco Use  ? Smoking status: Former  ?  Types: Cigarettes  ? Smokeless tobacco: Former  ?Substance Use Topics  ? Alcohol use: Not Currently  ? ? ?Allergies  ?Allergen Reactions  ? Aspirin   ?  Other reaction(s): stomach burns  ? Omnipaque [Iohexol] Swelling  ?  Pt states at [facility] Radiology in [place] on 05/09/2016 he swelled all over after receiving 64m of Omnipaque 350 concentration.   ? Penicillamine   ?  Other reaction(s): hallucination  ? Penicillins Other (See Comments)  ?  hallucinations  ? ? ?Current Outpatient Medications  ?Medication Sig Dispense Refill  ? BINAXNOW COVID-19  AG HOME TEST KIT TEST AS DIRECTED TODAY    ? clotrimazole-betamethasone (LOTRISONE) cream 1 application    ? dapagliflozin propanediol (FARXIGA) 10 MG TABS tablet 1 tablet    ? desloratadine (CLARINEX) 5 MG tablet Take 5 mg by mouth daily as needed.     ? EPINEPHrine 0.3 mg/0.3 mL IJ SOAJ injection Inject 0.3 mg into the muscle as needed.     ? famciclovir (FAMVIR) 500 MG tablet Take 500 mg by mouth daily as needed.     ? gabapentin (NEURONTIN) 100 MG capsule Take 100 mg by mouth daily as needed.    ? gabapentin (NEURONTIN) 300 MG capsule Take 300 mg by mouth as needed.     ? glipiZIDE (GLUCOTROL) 10 MG tablet 1 tablet    ? Glucos-Chond-Hyal Ac-Ca Fructo (MOVE FREE JOINT HEALTH ADVANCE) TABS See admin instructions.    ? glucose blood (CONTOUR NEXT TEST) test strip See admin instructions.    ? hydrOXYzine (ATARAX/VISTARIL) 25 MG tablet 1 tablet as needed    ? JANUVIA 100 MG tablet     ? loratadine (CLARITIN) 10 MG tablet Take 10 mg by mouth daily.    ? metFORMIN (GLUCOPHAGE) 1000 MG tablet 1 tablet with a meal    ? metFORMIN (GLUMETZA) 1000 MG (MOD) 24 hr tablet  metformin ER 1,000 mg 24 hr tablet,extended release    ? Microlet Lancets MISC See admin instructions.    ? Omega-3 Fatty Acids (FISH OIL) 1000 MG CAPS 1 capsule    ? PAXLOVID, 300/100, 20 x 150 MG & 10 x 100MG TBPK See admin instructions.    ? SHINGRIX injection     ? simvastatin (ZOCOR) 10 MG tablet     ? triamcinolone cream (KENALOG) 0.5 % APP EXT AA BID FOR 1 WK    ? ?No current facility-administered medications for this visit.  ? ? ?Review of Systems  ?Constitutional:  Constitutional negative. ?HENT: HENT negative.  ?Eyes: Eyes negative.  ?Respiratory: Respiratory negative.  ?Cardiovascular: Positive for claudication.  ?Musculoskeletal: Musculoskeletal negative.  ?Skin: Skin negative.  ?Neurological: Neurological negative. ?Hematologic: Hematologic/lymphatic negative.  ?Psychiatric: Psychiatric negative.   ? ?   ?Objective:  ?Objective  ? ?Vitals:  ?  03/31/21 1210  ?BP: 119/75  ?Pulse: 89  ?Resp: 20  ?Temp: 98.2 ?F (36.8 ?C)  ?SpO2: 93%  ?Weight: 176 lb (79.8 kg)  ?Height: _0  (1.753 m)  ? ?Body mass index is 25.99 kg/m?. ? ?Physical Exam ?HENT:  ?   Head: Normocephalic.  ?   Nose:  ?   Comments: Wearing a mask ?Eyes:  ?   Pupils: Pupils are equal, round, and reactive to light.  ?Cardiovascular:  ?   Rate and Rhythm: Normal rate.  ?   Pulses:     ?     Femoral pulses are 2+ on the right side and 2+ on the left side. ?     Popliteal pulses are 0 on the right side and 0 on the left side.  ?Pulmonary:  ?   Effort: Pulmonary effort is normal.  ?Abdominal:  ?   General: Abdomen is flat.  ?   Palpations: Abdomen is soft. There is no mass.  ?Musculoskeletal:     ?   General: Normal range of motion.  ?   Right lower leg: No edema.  ?   Left lower leg: No edema.  ?Skin: ?   General: Skin is warm.  ?   Capillary Refill: Capillary refill takes less than 2 seconds.  ?Psychiatric:     ?   Mood and Affect: Mood normal.     ?   Behavior: Behavior normal.     ?   Thought Content: Thought content normal.     ?   Judgment: Judgment normal.  ? ? ?Data: ?ABI Findings:  ?+---------+------------------+-----+----------+--------+  ?Right    Rt Pressure (mmHg)IndexWaveform  Comment   ?+---------+------------------+-----+----------+--------+  ?Brachial 140                                        ?+---------+------------------+-----+----------+--------+  ?PTA      82                0.57 biphasic            ?+---------+------------------+-----+----------+--------+  ?DP       75                0.52 monophasic          ?+---------+------------------+-----+----------+--------+  ?Great Toe62                0.43                     ?+---------+------------------+-----+----------+--------+  ? ?+---------+------------------+-----+--------+-------+  ?  Left     Lt Pressure (mmHg)IndexWaveformComment  ?+---------+------------------+-----+--------+-------+   ?Brachial 143                                     ?+---------+------------------+-----+--------+-------+  ?PTA      102               0.71 biphasic         ?+---------+------------------+-----+--------+-------+  ?DP       122               0.85 biphasic         ?+---------+------------------+-----+--------+-------+  ?Great Toe58                0.41                  ?+---------+------------------+-----+--------+-------+  ? ?+-------+-----------+-----------+------------+------------+  ?ABI/TBIToday's ABIToday's TBIPrevious ABIPrevious TBI  ?+-------+-----------+-----------+------------+------------+  ?Right  0.57       0.43                                 ?+-------+-----------+-----------+------------+------------+  ?Left   0.85       0.41                                 ?+-------+-----------+-----------+------------+------------+  ? ?   ? ?    ?Assessment/Plan:  ?  ?71 year old male with right lower extremity claudication with ABIs that support his diagnosis and symptoms.  I have asked him to start baby aspirin daily along with a statin that he takes already.  At this time he does not have life limiting symptoms.  I recommended dedicated walking with increasing distances with time.  He also needs to protect his feet particularly his right to prevent any wounds.  He demonstrates good understanding.  He will be on aspirin and statin and follow-up with him in 1 year with repeat ABIs. ? ?  ? ?Waynetta Sandy MD ?[facility] ? ? ?

## 2021-04-30 ENCOUNTER — Ambulatory Visit (INDEPENDENT_AMBULATORY_CARE_PROVIDER_SITE_OTHER): Payer: Medicare Other | Admitting: Podiatry

## 2021-04-30 DIAGNOSIS — L84 Corns and callosities: Secondary | ICD-10-CM | POA: Diagnosis not present

## 2021-04-30 DIAGNOSIS — B351 Tinea unguium: Secondary | ICD-10-CM | POA: Diagnosis not present

## 2021-04-30 DIAGNOSIS — E1142 Type 2 diabetes mellitus with diabetic polyneuropathy: Secondary | ICD-10-CM | POA: Diagnosis not present

## 2021-04-30 DIAGNOSIS — M79674 Pain in right toe(s): Secondary | ICD-10-CM

## 2021-04-30 DIAGNOSIS — M79675 Pain in left toe(s): Secondary | ICD-10-CM | POA: Diagnosis not present

## 2021-04-30 DIAGNOSIS — Q828 Other specified congenital malformations of skin: Secondary | ICD-10-CM | POA: Diagnosis not present

## 2021-04-30 DIAGNOSIS — A6002 Herpesviral infection of other male genital organs: Secondary | ICD-10-CM | POA: Insufficient documentation

## 2021-05-07 ENCOUNTER — Encounter: Payer: Self-pay | Admitting: Podiatry

## 2021-05-07 NOTE — Progress Notes (Signed)
?  Subjective:  ?Patient ID: Richard Shaw, male    DOB: 1950-02-04,  MRN: [id number] ? ?[name] presents to clinic today for at risk foot care with history of diabetic neuropathy and painful porokeratotic lesion(s) bilaterally and painful mycotic toenails that limit ambulation. Painful toenails interfere with ambulation. Aggravating factors include wearing enclosed shoe gear. Pain is relieved with periodic professional debridement. Painful porokeratotic lesions are aggravated when weightbearing with and without shoegear. Pain is relieved with periodic professional debridement. ? ?New problem(s): None.  ? ?He has seen [name] in Vascular Surgery for evaluation of intermittent claudication. He is to follow up in one year. ? ?PCP is [name], MD , and last visit was October 29, 2020. ? ?Allergies  ?Allergen Reactions  ? Aspirin   ?  Other reaction(s): stomach burns  ? Omnipaque [Iohexol] Swelling  ?  Pt states at [facility] Radiology in [place] on 05/09/2016 he swelled all over after receiving 84m of Omnipaque 350 concentration.   ? Penicillamine   ?  Other reaction(s): hallucination  ? Penicillins Other (See Comments)  ?  hallucinations  ? ? ?Review of Systems: Negative except as noted in the HPI. ? ?Objective: No changes noted in today's physical examination. ?WSalil Raineriis a pleasant 71y.o. male in NAD. AAO X 3. ? ?Vascular Examination: ?CFT <3 seconds b/l LE. Palpable pedal pulses b/l LE. Pedal hair sparse. No pain with calf compression b/l. No edema noted b/l LE. No cyanosis or clubbing noted b/l LE. ? ?Dermatological Examination: ?Pedal integument with normal turgor, texture and tone BLE. Toenails 1-5 b/l elongated, discolored, dystrophic, thickened, crumbly with subungual debris and tenderness to dorsal palpation. Hyperkeratotic lesion(s) L 5th toe.  No erythema, no edema, no drainage, no fluctuance. Porokeratotic lesion(s) submet head 3 left foot and submet heads 1, 5 right foot. No  erythema, no edema, no drainage, no fluctuance. ? ?Musculoskeletal Examination: ?Normal muscle strength 5/5 to all lower extremity muscle groups bilaterally. HAV with bunion deformity noted b/l LE. Hammertoe deformity noted 2-5 b/l..Marland KitchenNo pain, crepitus or joint limitation noted with ROM b/l LE.  Patient ambulates independently without assistive aids. ? ?Neurological Examination: ?Pt has subjective symptoms of neuropathy. Protective sensation intact 5/5 intact bilaterally with 10g monofilament b/l. Vibratory sensation intact b/l. ? ?Assessment/Plan: ?1. Pain due to onychomycosis of toenails of both feet   ?2. Corns   ?3. Porokeratosis   ?4. Diabetic peripheral neuropathy associated with type 2 diabetes mellitus ([place]   ?  ?-Patient was evaluated and treated. All patient's and/or POA's questions/concerns answered on today's visit. ?-Mycotic toenails 1-5 bilaterally were debrided in length and girth with sterile nail nippers and dremel without incident. ?-Corn(s) L 5th toe pared utilizing sterile scalpel blade without complication or incident. Total number debrided=1. ?-Painful porokeratotic lesion(s) submet head 1 right foot, submet head 3 left foot, and submet head 5 right foot pared and enucleated with sterile scalpel blade without incident. Total number of lesions debrided=3. ?-Patient/POA to call should there be question/concern in the interim.  ?Return in about 9 weeks (around 07/02/2021). ? ?JMarzetta Board DPM  ?

## 2021-06-10 DIAGNOSIS — Z1331 Encounter for screening for depression: Secondary | ICD-10-CM | POA: Diagnosis not present

## 2021-06-10 DIAGNOSIS — A6002 Herpesviral infection of other male genital organs: Secondary | ICD-10-CM | POA: Diagnosis not present

## 2021-06-10 DIAGNOSIS — E1169 Type 2 diabetes mellitus with other specified complication: Secondary | ICD-10-CM | POA: Diagnosis not present

## 2021-06-10 DIAGNOSIS — Z1211 Encounter for screening for malignant neoplasm of colon: Secondary | ICD-10-CM | POA: Diagnosis not present

## 2021-06-10 DIAGNOSIS — Z0000 Encounter for general adult medical examination without abnormal findings: Secondary | ICD-10-CM | POA: Diagnosis not present

## 2021-06-10 DIAGNOSIS — L209 Atopic dermatitis, unspecified: Secondary | ICD-10-CM | POA: Diagnosis not present

## 2021-06-21 DIAGNOSIS — N480 Leukoplakia of penis: Secondary | ICD-10-CM | POA: Diagnosis not present

## 2021-06-30 ENCOUNTER — Encounter: Payer: Self-pay | Admitting: Podiatry

## 2021-06-30 ENCOUNTER — Ambulatory Visit (INDEPENDENT_AMBULATORY_CARE_PROVIDER_SITE_OTHER): Payer: Medicare Other | Admitting: Podiatry

## 2021-06-30 DIAGNOSIS — L84 Corns and callosities: Secondary | ICD-10-CM | POA: Diagnosis not present

## 2021-06-30 DIAGNOSIS — Q828 Other specified congenital malformations of skin: Secondary | ICD-10-CM | POA: Diagnosis not present

## 2021-06-30 DIAGNOSIS — M79674 Pain in right toe(s): Secondary | ICD-10-CM | POA: Diagnosis not present

## 2021-06-30 DIAGNOSIS — M79675 Pain in left toe(s): Secondary | ICD-10-CM | POA: Diagnosis not present

## 2021-06-30 DIAGNOSIS — B351 Tinea unguium: Secondary | ICD-10-CM

## 2021-06-30 DIAGNOSIS — E1142 Type 2 diabetes mellitus with diabetic polyneuropathy: Secondary | ICD-10-CM | POA: Diagnosis not present

## 2021-07-04 NOTE — Progress Notes (Signed)
  Subjective:  Patient ID: Richard Shaw, male    DOB: 09/22/1950,  MRN: [id number]  Richard Shaw presents to clinic today for at risk foot care with history of diabetic neuropathy and corn(s)  left lower extremity, porokeratotic lesion(s) b/l lower extremities and painful mycotic nails. Painful toenails interfere with ambulation. Aggravating factors include wearing enclosed shoe gear. Pain is relieved with periodic professional debridement. Painful corns and porokeratotic lesion(s) aggravated when weightbearing with and without shoegear. Pain is relieved with periodic professional debridement.  Patient states blood glucose was 172 mg/dl today.  Last known HgA1c was 7.7%.  New problem(s): None.   PCP is [name], MD , and last visit was March 22, 2021.  Allergies  Allergen Reactions   Aspirin     Other reaction(s): stomach burns Other reaction(s): stomach burns, Unknown Other reaction(s): stomach burns    Omnipaque [Iohexol] Swelling    Pt states at [facility]iology in [place] on 05/09/2016 he swelled all over after receiving 1m of Omnipaque 350 concentration.    Penicillamine     Other reaction(s): hallucination Other reaction(s): hallucination   Penicillins Other (See Comments)    hallucinations Other reaction(s): Other hallucinations    Review of Systems: Negative except as noted in the HPI.  Objective: No changes noted in today's physical examination.  Richard Sheerinis a pleasant 71y.o. male in NAD. AAO X 3.  Vascular Examination: CFT <3 seconds b/l LE. Palpable pedal pulses b/l LE. Pedal hair sparse. No pain with calf compression b/l. No edema noted b/l LE. No cyanosis or clubbing noted b/l LE.  Dermatological Examination: Pedal integument with normal turgor, texture and tone BLE. Toenails 1-5 b/l elongated, discolored, dystrophic, thickened, crumbly with subungual debris and tenderness to dorsal palpation. Hyperkeratotic lesion(s) L 5th toe.  No  erythema, no edema, no drainage, no fluctuance. Porokeratotic lesion(s) submet head 3 left foot and submet heads 1, 5 right foot. No erythema, no edema, no drainage, no fluctuance.  Musculoskeletal Examination: Normal muscle strength 5/5 to all lower extremity muscle groups bilaterally. HAV with bunion deformity noted b/l LE. Hammertoe deformity noted 2-5 b/l..Marland KitchenNo pain, crepitus or joint limitation noted with ROM b/l LE.  Patient ambulates independently without assistive aids.  Neurological Examination: Pt has subjective symptoms of neuropathy. Protective sensation intact 5/5 intact bilaterally with 10g monofilament b/l. Vibratory sensation intact b/l.  Assessment/Plan: 1. Pain due to onychomycosis of toenails of both feet   2. Corns   3. Porokeratosis   4. Diabetic peripheral neuropathy associated with type 2 diabetes mellitus ([place]      -Examined patient. -No new findings. No new orders. -Patient to continue soft, supportive shoe gear daily. -Toenails 1-5 b/l were debrided in length and girth with sterile nail nippers and dremel without iatrogenic bleeding.  -Corn(s) R 5th toe pared utilizing sterile scalpel blade without complication or incident. Total number debrided=1. -Porokeratotic lesion(s) submet head 1 right foot, submet head 3 left foot, and submet head 5 right foot pared and enucleated with sterile scalpel blade without incident. Total number of lesions debrided=3. -Patient/POA to call should there be question/concern in the interim.   Return in about 9 weeks (around 09/01/2021).  JMarzetta Board DPM

## 2021-07-16 DIAGNOSIS — R030 Elevated blood-pressure reading, without diagnosis of hypertension: Secondary | ICD-10-CM | POA: Diagnosis not present

## 2021-07-16 DIAGNOSIS — E1169 Type 2 diabetes mellitus with other specified complication: Secondary | ICD-10-CM | POA: Diagnosis not present

## 2021-07-16 DIAGNOSIS — Z1211 Encounter for screening for malignant neoplasm of colon: Secondary | ICD-10-CM | POA: Diagnosis not present

## 2021-07-28 DIAGNOSIS — L820 Inflamed seborrheic keratosis: Secondary | ICD-10-CM | POA: Diagnosis not present

## 2021-07-28 DIAGNOSIS — L821 Other seborrheic keratosis: Secondary | ICD-10-CM | POA: Diagnosis not present

## 2021-07-28 DIAGNOSIS — D485 Neoplasm of uncertain behavior of skin: Secondary | ICD-10-CM | POA: Diagnosis not present

## 2021-09-01 ENCOUNTER — Encounter: Payer: Self-pay | Admitting: Podiatry

## 2021-09-01 ENCOUNTER — Ambulatory Visit (INDEPENDENT_AMBULATORY_CARE_PROVIDER_SITE_OTHER): Payer: Medicare Other | Admitting: Podiatry

## 2021-09-01 DIAGNOSIS — E1142 Type 2 diabetes mellitus with diabetic polyneuropathy: Secondary | ICD-10-CM

## 2021-09-01 DIAGNOSIS — L84 Corns and callosities: Secondary | ICD-10-CM

## 2021-09-01 DIAGNOSIS — M79674 Pain in right toe(s): Secondary | ICD-10-CM | POA: Diagnosis not present

## 2021-09-01 DIAGNOSIS — M79675 Pain in left toe(s): Secondary | ICD-10-CM

## 2021-09-01 DIAGNOSIS — Z860100 Personal history of colon polyps, unspecified: Secondary | ICD-10-CM | POA: Insufficient documentation

## 2021-09-01 DIAGNOSIS — B351 Tinea unguium: Secondary | ICD-10-CM

## 2021-09-01 DIAGNOSIS — Q828 Other specified congenital malformations of skin: Secondary | ICD-10-CM

## 2021-09-07 NOTE — Progress Notes (Signed)
  Subjective:  Patient ID: Richard Shaw, male    DOB: 27-Sep-1950,  MRN: [id number]  Richard Shaw presents to clinic today for at risk foot care with history of diabetic neuropathy and corn(s)  left foot, porokeratotic lesion(s) b/l lower extremities and painful mycotic nails. Painful toenails interfere with ambulation. Aggravating factors include wearing enclosed shoe gear. Pain is relieved with periodic professional debridement. Painful corns and porokeratotic lesion(s) aggravated when weightbearing with and without shoegear. Pain is relieved with periodic professional debridement.  Patient states blood glucose was 142 mg/dl today.  Last A1c was 7.2%.  Last known HgA1c was unknown.  Patient did not check blood glucose today.  Patient does not monitor blood glucose daily.  Patient is not required to monitor blood glucose daily.  New problem(s): None.   PCP is [name], MD , and last visit was  March 22, 2021  Allergies  Allergen Reactions   Aspirin     Other reaction(s): stomach burns Other reaction(s): stomach burns, Unknown Other reaction(s): stomach burns    Omnipaque [Iohexol] Swelling    Pt states at [facility] Radiology in [place] on 05/09/2016 he swelled all over after receiving 60m of Omnipaque 350 concentration.    Penicillamine     Other reaction(s): hallucination Other reaction(s): hallucination   Penicillins Other (See Comments)    hallucinations Other reaction(s): Other hallucinations    Review of Systems: Negative except as noted in the HPI.  Objective: No changes noted in today's physical examination. WMasai Kiddis a pleasant 71y.o. male in NAD. AAO X 3.  Vascular Examination: CFT <3 seconds b/l LE. Palpable pedal pulses b/l LE. Pedal hair sparse. No pain with calf compression b/l. No edema noted b/l LE. No cyanosis or clubbing noted b/l LE.  Dermatological Examination: Pedal integument with normal turgor, texture and tone BLE. Toenails 1-5  b/l elongated, discolored, dystrophic, thickened, crumbly with subungual debris and tenderness to dorsal palpation. Hyperkeratotic lesion(s) L 5th toe.  No erythema, no edema, no drainage, no fluctuance. Porokeratotic lesion(s) submet head 3 left foot and submet heads 1, 5 right foot. No erythema, no edema, no drainage, no fluctuance.  Musculoskeletal Examination: Normal muscle strength 5/5 to all lower extremity muscle groups bilaterally. HAV with bunion deformity noted b/l LE. Hammertoe deformity noted 2-5 b/l..Marland KitchenNo pain, crepitus or joint limitation noted with ROM b/l LE.  Patient ambulates independently without assistive aids.  Neurological Examination: Pt has subjective symptoms of neuropathy. Protective sensation intact 5/5 intact bilaterally with 10g monofilament b/l. Vibratory sensation intact b/l.  Assessment/Plan: 1. Pain due to onychomycosis of toenails of both feet   2. Corns   3. Porokeratosis   4. Diabetic peripheral neuropathy associated with type 2 diabetes mellitus ([place]   -Patient was evaluated and treated. All patient's and/or POA's questions/concerns answered on today's visit. -Patient to continue soft, supportive shoe gear daily. -Mycotic toenails 1-5 bilaterally were debrided in length and girth with sterile nail nippers and dremel without incident. -Corn(s) right fifth digit pared utilizing sterile scalpel blade without complication or incident. Total number debrided=1. -Porokeratotic lesion(s) submet head 1 right foot, submet head 3 left foot, and submet head 5 right foot pared and enucleated with sterile scalpel blade without incident. Total number of lesions debrided=3. -Patient/POA to call should there be question/concern in the interim.   Return in about 3 months (around 12/02/2021).  Richard Shaw DPM

## 2021-10-05 DIAGNOSIS — Z23 Encounter for immunization: Secondary | ICD-10-CM | POA: Diagnosis not present

## 2021-10-18 DIAGNOSIS — Z23 Encounter for immunization: Secondary | ICD-10-CM | POA: Diagnosis not present

## 2021-11-03 ENCOUNTER — Encounter: Payer: Self-pay | Admitting: Podiatry

## 2021-11-03 ENCOUNTER — Ambulatory Visit (INDEPENDENT_AMBULATORY_CARE_PROVIDER_SITE_OTHER): Payer: Medicare Other | Admitting: Podiatry

## 2021-11-03 DIAGNOSIS — B351 Tinea unguium: Secondary | ICD-10-CM | POA: Diagnosis not present

## 2021-11-03 DIAGNOSIS — M79675 Pain in left toe(s): Secondary | ICD-10-CM

## 2021-11-03 DIAGNOSIS — M79674 Pain in right toe(s): Secondary | ICD-10-CM

## 2021-11-03 DIAGNOSIS — Q828 Other specified congenital malformations of skin: Secondary | ICD-10-CM

## 2021-11-03 DIAGNOSIS — E1142 Type 2 diabetes mellitus with diabetic polyneuropathy: Secondary | ICD-10-CM

## 2021-11-03 NOTE — Progress Notes (Signed)
  Subjective:  Patient ID: Richard Shaw, male    DOB: 1950-02-20,  MRN: [id number]  Richard Shaw presents to clinic today for:  Chief Complaint  Patient presents with   Diabetes    Diabetic foot Care, A1c- 7.0 BG- 135, PCP- Last seen 3 months ago, Nail and callus trim    New problem(s): None.   Patient has traveled to [place] to visit his father in law who is ill.  PCP is [name], MD , and last visit was  March 22, 2021.  Allergies  Allergen Reactions   Aspirin     Other reaction(s): stomach burns Other reaction(s): stomach burns, Unknown Other reaction(s): stomach burns    Omnipaque [Iohexol] Swelling    Pt states at [facility] Radiology in [place] on 05/09/2016 he swelled all over after receiving 28m of Omnipaque 350 concentration.    Penicillamine     Other reaction(s): hallucination Other reaction(s): hallucination   Penicillins Other (See Comments)    hallucinations Other reaction(s): Other hallucinations    Review of Systems: Negative except as noted in the HPI.  Objective: No changes noted in today's physical examination.  Richard Shaw a pleasant 71y.o. male WD, WN in NAD. AAO x 3.  Vascular Examination: CFT <3 seconds b/l LE. Palpable pedal pulses b/l LE. Pedal hair sparse. No pain with calf compression b/l. No edema noted b/l LE. No cyanosis or clubbing noted b/l LE.  Dermatological Examination: Pedal integument with normal turgor, texture and tone BLE. Toenails 1-5 b/l elongated, discolored, dystrophic, thickened, crumbly with subungual debris and tenderness to dorsal palpation.   Porokeratotic lesion(s) submet head 3 left foot and submet heads 1, 5 right foot. No erythema, no edema, no drainage, no fluctuance.  Musculoskeletal Examination: Normal muscle strength 5/5 to all lower extremity muscle groups bilaterally. HAV with bunion deformity noted b/l LE. Hammertoe deformity noted 2-5 b/l..Richard KitchenNo pain, crepitus or joint limitation noted with ROM  b/l LE.  Patient ambulates independently without assistive aids.  Neurological Examination: Pt has subjective symptoms of neuropathy. Protective sensation intact 5/5 intact bilaterally with 10g monofilament b/l. Vibratory sensation intact b/l.  Assessment/Plan: 1. Pain due to onychomycosis of toenails of both feet   2. Porokeratosis   3. Diabetic peripheral neuropathy associated with type 2 diabetes mellitus ([place]     No orders of the defined types were placed in this encounter.   -Consent given for treatment as described below: -Examined patient. -Continue foot and shoe inspections daily. Monitor blood glucose per PCP/Endocrinologist's recommendations. -Toenails 1-5 b/l were debrided in length and girth with sterile nail nippers and dremel without iatrogenic bleeding.  -Porokeratotic lesion(s) submet head 1 right foot, submet head 3 left foot, and submet head 5 right foot pared and enucleated with sterile currette without incident. Total number of lesions debrided=3. -Patient/POA to call should there be question/concern in the interim.   Return in about 3 months (around 02/03/2022).  JMarzetta Board DPM

## 2021-11-08 DIAGNOSIS — R0789 Other chest pain: Secondary | ICD-10-CM | POA: Diagnosis not present

## 2021-11-08 DIAGNOSIS — Z03818 Encounter for observation for suspected exposure to other biological agents ruled out: Secondary | ICD-10-CM | POA: Diagnosis not present

## 2021-11-08 DIAGNOSIS — J209 Acute bronchitis, unspecified: Secondary | ICD-10-CM | POA: Diagnosis not present

## 2021-11-22 DIAGNOSIS — K635 Polyp of colon: Secondary | ICD-10-CM | POA: Diagnosis not present

## 2021-11-22 DIAGNOSIS — K648 Other hemorrhoids: Secondary | ICD-10-CM | POA: Diagnosis not present

## 2021-11-22 DIAGNOSIS — Z86010 Personal history of colonic polyps: Secondary | ICD-10-CM | POA: Diagnosis not present

## 2021-11-22 DIAGNOSIS — Z09 Encounter for follow-up examination after completed treatment for conditions other than malignant neoplasm: Secondary | ICD-10-CM | POA: Diagnosis not present

## 2021-11-24 DIAGNOSIS — K635 Polyp of colon: Secondary | ICD-10-CM | POA: Diagnosis not present

## 2021-12-17 ENCOUNTER — Ambulatory Visit
Admission: RE | Admit: 2021-12-17 | Discharge: 2021-12-17 | Disposition: A | Discharge status: Home / Self Care | Payer: Medicare Other | Source: Ambulatory Visit | Attending: Internal Medicine | Admitting: Internal Medicine

## 2021-12-17 ENCOUNTER — Other Ambulatory Visit: Payer: Self-pay | Admitting: Internal Medicine

## 2021-12-17 DIAGNOSIS — M25552 Pain in left hip: Secondary | ICD-10-CM

## 2021-12-17 DIAGNOSIS — E1169 Type 2 diabetes mellitus with other specified complication: Secondary | ICD-10-CM | POA: Diagnosis not present

## 2021-12-17 DIAGNOSIS — G629 Polyneuropathy, unspecified: Secondary | ICD-10-CM | POA: Diagnosis not present

## 2022-01-04 DIAGNOSIS — R2689 Other abnormalities of gait and mobility: Secondary | ICD-10-CM | POA: Diagnosis not present

## 2022-01-04 DIAGNOSIS — R200 Anesthesia of skin: Secondary | ICD-10-CM | POA: Diagnosis not present

## 2022-01-04 DIAGNOSIS — I739 Peripheral vascular disease, unspecified: Secondary | ICD-10-CM | POA: Diagnosis not present

## 2022-01-04 DIAGNOSIS — M542 Cervicalgia: Secondary | ICD-10-CM | POA: Diagnosis not present

## 2022-01-04 DIAGNOSIS — E119 Type 2 diabetes mellitus without complications: Secondary | ICD-10-CM | POA: Diagnosis not present

## 2022-01-04 DIAGNOSIS — R202 Paresthesia of skin: Secondary | ICD-10-CM | POA: Diagnosis not present

## 2022-01-04 DIAGNOSIS — G8929 Other chronic pain: Secondary | ICD-10-CM | POA: Diagnosis not present

## 2022-01-04 DIAGNOSIS — E1142 Type 2 diabetes mellitus with diabetic polyneuropathy: Secondary | ICD-10-CM | POA: Diagnosis not present

## 2022-01-04 DIAGNOSIS — G3184 Mild cognitive impairment, so stated: Secondary | ICD-10-CM | POA: Diagnosis not present

## 2022-01-04 DIAGNOSIS — M545 Low back pain, unspecified: Secondary | ICD-10-CM | POA: Diagnosis not present

## 2022-01-05 ENCOUNTER — Encounter: Payer: Self-pay | Admitting: Podiatry

## 2022-01-05 ENCOUNTER — Ambulatory Visit (INDEPENDENT_AMBULATORY_CARE_PROVIDER_SITE_OTHER): Payer: Medicare Other | Admitting: Podiatry

## 2022-01-05 VITALS — BP 145/71

## 2022-01-05 DIAGNOSIS — B351 Tinea unguium: Secondary | ICD-10-CM

## 2022-01-05 DIAGNOSIS — L84 Corns and callosities: Secondary | ICD-10-CM

## 2022-01-05 DIAGNOSIS — Q828 Other specified congenital malformations of skin: Secondary | ICD-10-CM | POA: Diagnosis not present

## 2022-01-05 DIAGNOSIS — E663 Overweight: Secondary | ICD-10-CM | POA: Diagnosis not present

## 2022-01-05 DIAGNOSIS — Z1389 Encounter for screening for other disorder: Secondary | ICD-10-CM | POA: Diagnosis not present

## 2022-01-05 DIAGNOSIS — M79674 Pain in right toe(s): Secondary | ICD-10-CM

## 2022-01-05 DIAGNOSIS — Z6826 Body mass index (BMI) 26.0-26.9, adult: Secondary | ICD-10-CM | POA: Diagnosis not present

## 2022-01-05 DIAGNOSIS — M79675 Pain in left toe(s): Secondary | ICD-10-CM | POA: Diagnosis not present

## 2022-01-05 DIAGNOSIS — E8889 Other specified metabolic disorders: Secondary | ICD-10-CM | POA: Diagnosis not present

## 2022-01-05 DIAGNOSIS — E1142 Type 2 diabetes mellitus with diabetic polyneuropathy: Secondary | ICD-10-CM | POA: Diagnosis not present

## 2022-01-05 DIAGNOSIS — E1169 Type 2 diabetes mellitus with other specified complication: Secondary | ICD-10-CM | POA: Diagnosis not present

## 2022-01-05 NOTE — Progress Notes (Signed)
Subjective:  Patient ID: Richard Shaw, male    DOB: 1950-07-07,  MRN: [id number]  Richard Shaw presents to clinic today for at risk foot care with history of diabetic neuropathy and painful porokeratotic lesion(s) b/l lower extremities and painful mycotic toenails that limit ambulation. Painful toenails interfere with ambulation. Aggravating factors include wearing enclosed shoe gear. Pain is relieved with periodic professional debridement. Painful porokeratotic lesions are aggravated when weightbearing with and without shoegear. Pain is relieved with periodic professional debridement.  Chief Complaint  Patient presents with   Nail Problem    Diabetic foot care BS- did not check A1C-7.9 PCP-Varadarajan PCP VST-2 months ago   New problem(s): None.   Patient has been evaluated by Vascular Surgery. He had an evaluation by Neurology at [facility] on yesterday. Testing results pending.  PCP is Richard Cha, MD.  Allergies  Allergen Reactions   Aspirin     Other reaction(s): stomach burns Other reaction(s): stomach burns, Unknown Other reaction(s): stomach burns    Omnipaque [Iohexol] Swelling    Pt states at [facility]iology in [place] on 05/09/2016 he swelled all over after receiving 30m of Omnipaque 350 concentration.    Penicillamine     Other reaction(s): hallucination Other reaction(s): hallucination   Penicillins Other (See Comments)    hallucinations Other reaction(s): Other hallucinations    Review of Systems: Negative except as noted in the HPI.  Objective: No changes noted in today's physical examination. There were no vitals filed for this visit.  Richard Shaw a pleasant 71y.o. male WD, WN in NAD. AAO x 3.  Vascular Examination: CFT <3 seconds b/l LE. Palpable pedal pulses b/l LE. Pedal hair sparse. No pain with calf compression b/l. No edema noted b/l LE. No cyanosis or clubbing noted b/l LE.  Dermatological Examination: Pedal  integument with normal turgor, texture and tone BLE. Toenails 1-5 b/l elongated, discolored, dystrophic, thickened, crumbly with subungual debris and tenderness to dorsal palpation.   Hyperkeratotic lesion right 5th digit. Porokeratotic lesion(s) submet head 3 left foot and submet heads 1, 5 right foot. No erythema, no edema, no drainage, no fluctuance.  Musculoskeletal Examination: Normal muscle strength 5/5 to all lower extremity muscle groups bilaterally. HAV with bunion deformity noted b/l LE. Hammertoe deformity noted 2-5 b/l. No pain, crepitus or joint limitation noted with ROM b/l LE.  Patient ambulates independently without assistive aids.  Neurological Examination: Pt has subjective symptoms of neuropathy. Protective sensation intact 5/5 intact bilaterally with 10g monofilament b/l. Vibratory sensation intact b/l.  Assessment/Plan: 1. Pain due to onychomycosis of toenails of both feet   2. Porokeratosis   3. Corns   4. Diabetic peripheral neuropathy associated with type 2 diabetes mellitus (HCC)     No orders of the defined types were placed in this encounter.   -Consent given for treatment as described below: -[facility] b/l were debrided in length and girth with sterile nail nippers and dremel without iatrogenic bleeding.  -Corn right 5th digit pared without incident. Total number of lesions debrided=1. -Porokeratotic lesion(s) submet head 1 right foot, submet head 3 right foot, and submet head 5 right foot pared and enucleated with sterile currette. Pinpoint bleeding submet head 5 right foot lesion addressed with Lumicain Hemostatic Solution. TAO and band-aid applied. He may remove on tomorrow. No further treatment required. Total number of lesions debrided=3. -Patient/POA to call should there be question/concern in the interim.   Return in  about 3 months (around 04/06/2022).  Marzetta Board,  DPM

## 2022-01-07 ENCOUNTER — Other Ambulatory Visit (HOSPITAL_COMMUNITY): Payer: Self-pay | Admitting: Neurology

## 2022-01-07 DIAGNOSIS — G3184 Mild cognitive impairment, so stated: Secondary | ICD-10-CM

## 2022-01-07 DIAGNOSIS — R200 Anesthesia of skin: Secondary | ICD-10-CM

## 2022-01-12 DIAGNOSIS — R200 Anesthesia of skin: Secondary | ICD-10-CM | POA: Diagnosis not present

## 2022-01-12 DIAGNOSIS — R202 Paresthesia of skin: Secondary | ICD-10-CM | POA: Diagnosis not present

## 2022-01-17 ENCOUNTER — Ambulatory Visit: Payer: Medicare Other | Admitting: Dermatology

## 2022-01-19 DIAGNOSIS — Z6826 Body mass index (BMI) 26.0-26.9, adult: Secondary | ICD-10-CM | POA: Diagnosis not present

## 2022-01-19 DIAGNOSIS — E1169 Type 2 diabetes mellitus with other specified complication: Secondary | ICD-10-CM | POA: Diagnosis not present

## 2022-01-19 DIAGNOSIS — E663 Overweight: Secondary | ICD-10-CM | POA: Diagnosis not present

## 2022-01-20 DIAGNOSIS — M5440 Lumbago with sciatica, unspecified side: Secondary | ICD-10-CM | POA: Diagnosis not present

## 2022-01-20 DIAGNOSIS — Z6826 Body mass index (BMI) 26.0-26.9, adult: Secondary | ICD-10-CM | POA: Diagnosis not present

## 2022-01-20 DIAGNOSIS — E1169 Type 2 diabetes mellitus with other specified complication: Secondary | ICD-10-CM | POA: Diagnosis not present

## 2022-01-21 ENCOUNTER — Ambulatory Visit (HOSPITAL_COMMUNITY): Payer: Medicare Other | Attending: Neurology

## 2022-01-21 ENCOUNTER — Encounter (HOSPITAL_COMMUNITY): Payer: Self-pay

## 2022-02-09 ENCOUNTER — Ambulatory Visit
Admission: RE | Admit: 2022-02-09 | Discharge: 2022-02-09 | Disposition: A | Discharge status: Home / Self Care | Payer: Medicare HMO | Source: Ambulatory Visit | Attending: Physical Medicine and Rehabilitation | Admitting: Physical Medicine and Rehabilitation

## 2022-02-09 ENCOUNTER — Other Ambulatory Visit: Payer: Self-pay | Admitting: Physical Medicine and Rehabilitation

## 2022-02-09 DIAGNOSIS — M47816 Spondylosis without myelopathy or radiculopathy, lumbar region: Secondary | ICD-10-CM

## 2022-02-18 ENCOUNTER — Ambulatory Visit (HOSPITAL_COMMUNITY)
Admission: RE | Admit: 2022-02-18 | Discharge: 2022-02-18 | Disposition: A | Discharge status: Home / Self Care | Payer: Medicare HMO | Source: Ambulatory Visit | Attending: Neurology | Admitting: Neurology

## 2022-02-18 DIAGNOSIS — R202 Paresthesia of skin: Secondary | ICD-10-CM | POA: Insufficient documentation

## 2022-02-18 DIAGNOSIS — G3184 Mild cognitive impairment, so stated: Secondary | ICD-10-CM | POA: Diagnosis present

## 2022-02-18 DIAGNOSIS — R200 Anesthesia of skin: Secondary | ICD-10-CM

## 2022-02-21 ENCOUNTER — Institutional Professional Consult (permissible substitution): Payer: Medicare Other | Admitting: Neurology

## 2022-04-13 ENCOUNTER — Encounter: Payer: Self-pay | Admitting: Podiatry

## 2022-04-22 ENCOUNTER — Ambulatory Visit: Payer: Medicare HMO | Admitting: Podiatry

## 2022-05-09 ENCOUNTER — Other Ambulatory Visit: Payer: Self-pay | Admitting: Physician Assistant

## 2022-05-09 DIAGNOSIS — N289 Disorder of kidney and ureter, unspecified: Secondary | ICD-10-CM

## 2022-05-11 ENCOUNTER — Other Ambulatory Visit: Payer: Self-pay | Admitting: *Deleted

## 2022-05-11 DIAGNOSIS — I70211 Atherosclerosis of native arteries of extremities with intermittent claudication, right leg: Secondary | ICD-10-CM

## 2022-05-18 ENCOUNTER — Ambulatory Visit (INDEPENDENT_AMBULATORY_CARE_PROVIDER_SITE_OTHER): Payer: Medicare HMO | Admitting: Physician Assistant

## 2022-05-18 ENCOUNTER — Ambulatory Visit (HOSPITAL_COMMUNITY)
Admission: RE | Admit: 2022-05-18 | Discharge: 2022-05-18 | Disposition: A | Discharge status: Home / Self Care | Payer: Medicare HMO | Source: Ambulatory Visit | Attending: Vascular Surgery | Admitting: Vascular Surgery

## 2022-05-18 VITALS — BP 127/77 | HR 80 | Temp 97.9°F | Ht 69.0 in | Wt 162.0 lb

## 2022-05-18 DIAGNOSIS — I70211 Atherosclerosis of native arteries of extremities with intermittent claudication, right leg: Secondary | ICD-10-CM

## 2022-05-18 LAB — VAS US ABI WITH/WO TBI
Left ABI: 0.59
Right ABI: 0.77

## 2022-05-18 NOTE — Progress Notes (Signed)
Office Note     CC:  follow up Requesting Provider:  Lorenda Ishihara,*  HPI: Richard Shaw is a 72 y.o. (Sep 09, 1950) male who presents for follow up of PAD. He was initially seen by Dr. Randie Heinz last march with RLE claudication and ABIs that supported his symptoms. His symptoms were not lifestyle limiting so medical management with Aspirin 81 mg, Statin and walking regimen were recommended. He returns today with his wife. He recently has been having bilateral hip and thigh pain. This is " just a pain". It is worse upon first waking or getting ambulating but improves once ambulating for a little while. He says it starts in low back and radiates down both legs. He denies any pain in his lower legs or feet. No non healing wounds. He has not been ambulating as much recently as result of this discomfort so the prior claudication symptoms he was having he has not experienced recently. He is currently being managed by Dr. Dion Saucier for sciatic pain. He has not had any relief yet and wanted to have his circulation re evaluated to make sure it was not contributing. He does state that he has been compliant with his statin but stopped taking his Aspirin a month or so ago with concerns about his kidneys and taking too many medications.    Past Medical History:  Diagnosis Date   Chronic kidney disease    Diabetes mellitus without complication    Hepatitis C infection 1969   Herpes simplex    History of CVA (cerebrovascular accident) 07/2017   Hyperlipidemia    Neck pain    Tension headache     Past Surgical History:  Procedure Laterality Date   None      Social History   Socioeconomic History   Marital status: Married    Spouse name: Not on file   Number of children: 2   Years of education: 12   Highest education level: High school graduate  Occupational History   Occupation: Retired  Tobacco Use   Smoking status: Former    Types: Cigarettes   Smokeless tobacco: Former  Haematologist Use: Never used  Substance and Sexual Activity   Alcohol use: Not Currently   Drug use: Not Currently   Sexual activity: Not on file  Other Topics Concern   Not on file  Social History Narrative   Lives at home with his wife.   Right-handed.   One cup caffeine per day.   Social Determinants of Health   Financial Resource Strain: Not on file  Food Insecurity: Not on file  Transportation Needs: Not on file  Physical Activity: Not on file  Stress: Not on file  Social Connections: Not on file  Intimate Partner Violence: Not on file    Family History  Problem Relation Age of Onset   Breast cancer Mother    Stomach cancer Father    Heart disease Father     Current Outpatient Medications  Medication Sig Dispense Refill   BINAXNOW COVID-19 AG HOME TEST KIT TEST AS DIRECTED TODAY     canagliflozin (INVOKANA) 100 MG TABS tablet Take 1 tablet every day by oral route for 90 days.     clotrimazole-betamethasone (LOTRISONE) cream 1 application     desloratadine (CLARINEX) 5 MG tablet Take 5 mg by mouth daily as needed.      EPINEPHrine 0.3 mg/0.3 mL IJ SOAJ injection Inject 0.3 mg into the muscle as needed.  Glucos-Chond-Hyal Ac-Ca Fructo (MOVE FREE JOINT HEALTH ADVANCE) TABS See admin instructions.     glucose blood (CONTOUR NEXT TEST) test strip See admin instructions.     hydrOXYzine (ATARAX/VISTARIL) 25 MG tablet 1 tablet as needed     JARDIANCE 10 MG TABS tablet Take by mouth.     loratadine (CLARITIN) 10 MG tablet Take 10 mg by mouth daily.     loratadine (CLARITIN) 10 MG tablet 1 tablet     metFORMIN (GLUCOPHAGE) 1000 MG tablet 1 tablet with a meal     metFORMIN (GLUCOPHAGE) 1000 MG tablet Take 1 tablet by mouth 2 (two) times daily with a meal.     metFORMIN (GLUMETZA) 1000 MG (MOD) 24 hr tablet metformin ER 1,000 mg 24 hr tablet,extended release     Microlet Lancets MISC See admin instructions.     SEMAGLUTIDE, 1 MG/DOSE, [place] Inject 25 mg into the skin once a week.      SHINGRIX injection      simvastatin (ZOCOR) 10 MG tablet      simvastatin (ZOCOR) 10 MG tablet Take 1 tablet by mouth daily.     triamcinolone cream (KENALOG) 0.5 % APP EXT AA BID FOR 1 WK     No current facility-administered medications for this visit.    Allergies  Allergen Reactions   Aspirin     Other reaction(s): stomach burns Other reaction(s): stomach burns, Unknown Other reaction(s): stomach burns    Omnipaque [Iohexol] Swelling    Pt states at [facility]iology in [place] on 05/09/2016 he swelled all over after receiving 75mL of Omnipaque 350 concentration.    Penicillamine     Other reaction(s): hallucination Other reaction(s): hallucination   Penicillins Other (See Comments)    hallucinations Other reaction(s): Other hallucinations     REVIEW OF SYSTEMS:   denotes positive finding,  denotes negative finding Cardiac  Comments:  Chest pain or chest pressure:    Shortness of breath upon exertion:    Short of breath when lying flat:    Irregular heart rhythm:        Vascular    Pain in calf, thigh, or hip brought on by ambulation:    Pain in feet at night that wakes you up from your sleep:     Blood clot in your veins:    Leg swelling:         Pulmonary    Oxygen at home:    Productive cough:     Wheezing:         Neurologic    Sudden weakness in arms or legs:     Sudden numbness in arms or legs:     Sudden onset of difficulty speaking or slurred speech:    Temporary loss of vision in one eye:     Problems with dizziness:         Gastrointestinal    Blood in stool:     Vomited blood:         Genitourinary    Burning when urinating:     Blood in urine:        Psychiatric    Major depression:         Hematologic    Bleeding problems:    Problems with blood clotting too easily:        Skin    Rashes or ulcers:        Constitutional    Fever or chills:      PHYSICAL EXAMINATION:  Vitals:  05/18/22 1430  BP: 127/77  Pulse:  80  Temp: 97.9 F (36.6 C)  TempSrc: Temporal  SpO2: 97%  Weight: 162 lb (73.5 kg)  Height:  (1.753 m)    General:  WDWN in NAD; vital signs documented above Gait: Normal HENT: WNL, normocephalic Pulmonary: normal non-labored breathing , without wheezing Cardiac: regular HR Abdomen: soft, NT Vascular Exam/Pulses: 2+ femoral, 2+ Dp pulses bilaterally, feet warm and well perfused Extremities: without ischemic changes, without Gangrene , without cellulitis; without open wounds Musculoskeletal: no muscle wasting or atrophy  Neurologic: A&O X 3 Psychiatric:  The pt has Normal affect.   Non-Invasive Vascular Imaging:   +-------+-----------+-----------+------------+------------+  ABI/TBIToday's ABIToday's TBIPrevious ABIPrevious TBI  +-------+-----------+-----------+------------+------------+  Right 0.77       0.59       0.57        0.43          +-------+-----------+-----------+------------+------------+  Left  0.59       0.39       0.85        0.41          +-------+-----------+-----------+------------+------------+    ASSESSMENT/PLAN:: 72 y.o. male here for follow up for PAD. He has been having recent bilateral hip and thigh pain. This does not sound like hip/ buttock claudication. He has palpable femoral and DP pulses bilaterally. His ABI today is improved on right and decreased on left compared to prior. He has not recently been having any claudication symptoms because he has limited his ambulation secondary to his back and leg pain. I suspect that his pain is more musculoskeletal. I recommend that he follow up with Dr. Dion Saucier for further management of this - I have encouraged  him to restart his Aspirin 81 mg and continue his statin - I also encourage him to resume exercise- walking or biking, etc - No intervention is indicated at this time - I will have him follow up in 1 year with repeat ABI - He understands to call for earlier follow up if any new or  concerning symptoms   Graceann Congress, PA-C [facility] [phone]  Clinic MD:

## 2022-05-26 ENCOUNTER — Ambulatory Visit
Admission: RE | Admit: 2022-05-26 | Discharge: 2022-05-26 | Disposition: A | Discharge status: Home / Self Care | Payer: Medicare HMO | Source: Ambulatory Visit | Attending: Physician Assistant | Admitting: Physician Assistant

## 2022-05-26 DIAGNOSIS — N289 Disorder of kidney and ureter, unspecified: Secondary | ICD-10-CM

## 2022-05-26 MED ORDER — GADOPICLENOL 0.5 MMOL/ML IV SOLN
8.0000 mL | Freq: Once | INTRAVENOUS | Status: AC | PRN
  Administered 2022-05-26: 8 mL via INTRAVENOUS

## 2022-06-06 ENCOUNTER — Encounter: Payer: Self-pay | Admitting: Internal Medicine

## 2022-06-13 ENCOUNTER — Ambulatory Visit (INDEPENDENT_AMBULATORY_CARE_PROVIDER_SITE_OTHER): Payer: Medicare HMO | Admitting: Urology

## 2022-06-13 ENCOUNTER — Encounter: Payer: Self-pay | Admitting: Urology

## 2022-06-13 VITALS — BP 143/77 | HR 79 | Ht 69.0 in | Wt 160.0 lb

## 2022-06-13 DIAGNOSIS — N2889 Other specified disorders of kidney and ureter: Secondary | ICD-10-CM | POA: Diagnosis not present

## 2022-06-13 LAB — MICROSCOPIC EXAMINATION

## 2022-06-13 LAB — URINALYSIS, COMPLETE
Bilirubin, UA: NEGATIVE
Leukocytes,UA: NEGATIVE
Nitrite, UA: NEGATIVE
Protein,UA: NEGATIVE
RBC, UA: NEGATIVE
Specific Gravity, UA: 1.02 (ref 1.005–1.030)
Urobilinogen, Ur: 1 mg/dL (ref 0.2–1.0)
pH, UA: 6 (ref 5.0–7.5)

## 2022-06-13 NOTE — Progress Notes (Addendum)
I, Richard Shaw,acting as a scribe for Richard Altes, MD.,have documented all relevant documentation on the behalf of Richard Altes, MD,as directed by  Richard Altes, MD while in the presence of Richard Altes, MD.  06/13/2022 11:14 AM   Richard Shaw 12/23/1950 [id number]  Referring provider: Lorenda Ishihara, MD [address] [place],  [place] [address]  Chief Complaint  Patient presents with   Other    HPI: Richard Shaw is a 72 y.o. male here for evaluation of a right renal mass.  CT performed June 2020 without contrast, remarkable for a vague hypodensity, that was felt to represent a probable cyst. A follow-up MR abdomen with and without contrast was performed April 2024, which showed a 2.4 x 2.2 centimeter posterior right upper polar renal mass with heterogeneous peripheral enhancement. No bothersome lower urinary tract symptoms Denies dysuria or gross hematuria No flank or abdominal pain   PMH: Past Medical History:  Diagnosis Date   Chronic kidney disease    Diabetes mellitus without complication (HCC)    Hepatitis C infection 1969   Herpes simplex    History of CVA (cerebrovascular accident) 07/2017   Hyperlipidemia    Neck pain    Tension headache     Surgical History: Past Surgical History:  Procedure Laterality Date   None      Home Medications:  Allergies as of 06/13/2022       Reactions   Aspirin    Other reaction(s): stomach burns Other reaction(s): stomach burns, Unknown Other reaction(s): stomach burns   Omnipaque [iohexol] Swelling   Pt states at [facility]iology in [place] on 05/09/2016 he swelled all over after receiving 75mL of Omnipaque 350 concentration.    Penicillamine    Other reaction(s): hallucination Other reaction(s): hallucination   Penicillins Other (See Comments)   hallucinations Other reaction(s): Other hallucinations        Medication List        Accurate as of Jun 13, 2022 11:14 AM.  If you have any questions, ask your nurse or doctor.          STOP taking these medications    Invokana 100 MG Tabs tablet Generic drug: canagliflozin Stopped by: Richard Altes, MD   triamcinolone cream 0.5 % Commonly known as: KENALOG Stopped by: Richard Altes, MD       TAKE these medications    aspirin EC 81 MG tablet 1 tablet Orally Once a day   BinaxNOW COVID-19 Ag Home Test Kit Generic drug: COVID-19 At Home Antigen Test TEST AS DIRECTED TODAY   clotrimazole-betamethasone cream Commonly known as: LOTRISONE 1 application   Contour Next Test test strip Generic drug: glucose blood See admin instructions.   desloratadine 5 MG tablet Commonly known as: CLARINEX Take 5 mg by mouth daily as needed.   EPINEPHrine 0.3 mg/0.3 mL Soaj injection Commonly known as: EPI-PEN Inject 0.3 mg into the muscle as needed.   Fish Oil 1000 MG Caps 1 capsule   gabapentin 100 MG capsule Commonly known as: NEURONTIN TAKE 2 CAPSULE BY MOUTH EVERY DAY AS NEEDED for 30 days   hydrOXYzine 25 MG tablet Commonly known as: ATARAX 1 tablet as needed   Jardiance 25 MG Tabs tablet Generic drug: empagliflozin Take 25 mg by mouth daily. What changed: Another medication with the same name was removed. Continue taking this medication, and follow the directions you see here. Changed by: Richard Altes, MD   loratadine  10 MG tablet Commonly known as: CLARITIN Take 10 mg by mouth daily.   metFORMIN 1000 MG tablet Commonly known as: GLUCOPHAGE Take 1 tablet by mouth 2 (two) times daily with a meal.   Microlet Lancets Misc See admin instructions.   Move Free Joint Health Advance Tabs See admin instructions.   Ozempic (0.25 or 0.5 MG/DOSE) 2 MG/3ML Sopn Generic drug: Semaglutide(0.25 or 0.5MG /DOS) Inject 0.5 MG Subcutaneous Once a week for 30 days What changed: Another medication with the same name was removed. Continue taking this medication, and follow the directions you  see here. Changed by: Richard Altes, MD   Shingrix injection Generic drug: Zoster Vaccine Adjuvanted   simvastatin 10 MG tablet Commonly known as: ZOCOR Take 1 tablet by mouth daily.   traMADol 50 MG tablet Commonly known as: ULTRAM Take 50 mg by mouth 3 (three) times daily.   triamcinolone acetonide 40 MG/ML injection Commonly known as: KENALOG-40 Inject 2 mLs into the muscle once.        Allergies:  Allergies  Allergen Reactions   Aspirin     Other reaction(s): stomach burns Other reaction(s): stomach burns, Unknown Other reaction(s): stomach burns    Omnipaque [Iohexol] Swelling    Pt states at [facility]iology in [place] on 05/09/2016 he swelled all over after receiving 75mL of Omnipaque 350 concentration.    Penicillamine     Other reaction(s): hallucination Other reaction(s): hallucination   Penicillins Other (See Comments)    hallucinations Other reaction(s): Other hallucinations    Family History: Family History  Problem Relation Age of Onset   Breast cancer Mother    Stomach cancer Father    Heart disease Father     Social History:  reports that he has quit smoking. His smoking use included cigarettes. He has quit using smokeless tobacco. He reports that he does not currently use alcohol. He reports that he does not currently use drugs.   Physical Exam: BP (!) 143/77 (BP Location: Left Arm, Patient Position: Sitting, Cuff Size: Normal)   Pulse 79   Ht 5\' 9"  (1.753 m)   Wt 160 lb (72.6 kg)   BMI 23.63 kg/m   Constitutional:  Alert and oriented, No acute distress. HEENT: [place] AT Respiratory: Normal respiratory effort, no increased work of breathing. Psychiatric: Normal mood and affect.   Pertinent Imaging: MRI images were personally reviewed and interpreted.   EXAM: MRI ABDOMEN WITHOUT AND WITH CONTRAST   TECHNIQUE: Multiplanar multisequence MR imaging of the abdomen was performed both before and after the administration of intravenous  contrast.   CONTRAST:  8 mL Vueway   COMPARISON:  CT abdomen and pelvis dated 07/04/2018, MR lumbar spine dated 05/05/2022   FINDINGS: Lower chest: No acute findings.   Hepatobiliary: Mild signal loss on out of phase images. No bile duct dilation. Normal gallbladder.   Pancreas: No mass, inflammatory changes, or other parenchymal abnormality identified.   Spleen:  Within normal limits in size and appearance.   Adrenals/Urinary Tract: No adrenal nodules. 2.4 x 2.2 cm posterior right upper pole mass demonstrates heterogeneous peripheral enhancement. No hydronephrosis. Simple right interpolar cyst. Additional bilateral subcentimeter nonenhancing cysts.   Stomach/Bowel: Visualized portions within the abdomen are unremarkable.   Vascular/Lymphatic: No pathologically enlarged lymph nodes identified. No abdominal aortic aneurysm demonstrated.   Other:  None.   Musculoskeletal: No suspicious bone lesions identified.   IMPRESSION: 1. Posterior right upper pole renal mass measuring 2.4 cm is suspicious for primary renal neoplasm. 2. No evidence  of metastatic disease in the abdomen. 3. Mild hepatic steatosis.   Electronically Signed   By: Agustin Cree M.D.   On: 05/30/2022 15:54   Assessment & Plan:    1. Right renal mass  2.4 centimeter enhancing right renal mass. We discussed a solid renal mass raises the suspicion of primary renal malignancy.  We discussed this in detail and in regards to the spectrum of renal masses which includes cysts (pure cysts are considered benign), solid masses and everything in between. The risk of metastasis increases as the size of solid renal mass increases. In general, it is believed that the risk of metastasis for renal masses less than 3-4 cm is small (up to approximately 5%) based mainly on large retrospective studies. In some cases and especially in patients of older age and multiple comorbidities a surveillance approach may be appropriate. The  treatment of solid renal masses includes: surveillance, cryoablation (percutaneous and laparoscopic) in addition to partial and complete nephrectomy (each with option of laparoscopic, robotic and open depending on appropriateness). Furthermore, nephrectomy appears to be an independent risk factor for the development of chronic kidney disease suggesting that nephron sparing approaches should be implored whenever feasible. We reviewed these options in context of the patients current situation as well as the pros and cons of each.  He has elected surveillance and will schedule follow-up MRI in 3 months with office visit to discuss the results. He is scheduled for lumbar epidural steroid injections and there is no contraindication to this procedure  I have reviewed the above documentation for accuracy and completeness, and I agree with the above.   Richard Altes, MD  [facility] [address] [place], [place] [address] [phone]

## 2022-06-15 ENCOUNTER — Encounter: Payer: Self-pay | Admitting: Urology

## 2022-06-16 ENCOUNTER — Encounter: Payer: Self-pay | Admitting: Urology

## 2022-07-11 ENCOUNTER — Ambulatory Visit (INDEPENDENT_AMBULATORY_CARE_PROVIDER_SITE_OTHER): Payer: Medicare HMO | Admitting: Podiatry

## 2022-07-11 VITALS — BP 148/81

## 2022-07-11 DIAGNOSIS — Q828 Other specified congenital malformations of skin: Secondary | ICD-10-CM | POA: Diagnosis not present

## 2022-07-11 DIAGNOSIS — E119 Type 2 diabetes mellitus without complications: Secondary | ICD-10-CM

## 2022-07-11 DIAGNOSIS — B351 Tinea unguium: Secondary | ICD-10-CM | POA: Diagnosis not present

## 2022-07-11 DIAGNOSIS — I739 Peripheral vascular disease, unspecified: Secondary | ICD-10-CM | POA: Diagnosis not present

## 2022-07-11 DIAGNOSIS — M79674 Pain in right toe(s): Secondary | ICD-10-CM

## 2022-07-11 DIAGNOSIS — M79675 Pain in left toe(s): Secondary | ICD-10-CM | POA: Diagnosis not present

## 2022-07-11 DIAGNOSIS — E1142 Type 2 diabetes mellitus with diabetic polyneuropathy: Secondary | ICD-10-CM

## 2022-07-14 ENCOUNTER — Ambulatory Visit
Admission: RE | Admit: 2022-07-14 | Discharge: 2022-07-14 | Disposition: A | Discharge status: Home / Self Care | Payer: Medicare HMO | Source: Ambulatory Visit | Attending: Urology | Admitting: Urology

## 2022-07-14 DIAGNOSIS — N2889 Other specified disorders of kidney and ureter: Secondary | ICD-10-CM

## 2022-07-14 MED ORDER — GADOPICLENOL 0.5 MMOL/ML IV SOLN
7.0000 mL | Freq: Once | INTRAVENOUS | Status: AC | PRN
  Administered 2022-07-14: 7 mL via INTRAVENOUS

## 2022-07-16 ENCOUNTER — Encounter: Payer: Self-pay | Admitting: Podiatry

## 2022-07-16 NOTE — Progress Notes (Signed)
ANNUAL DIABETIC FOOT EXAM  Subjective: Richard Shaw presents today annual diabetic foot exam. Patient has h/o diabetic neuropathy and PAD. He is followed by Vascular Team annually. Chief Complaint  Patient presents with   Nail Problem    DFC,Referring Provider Lorenda Ishihara, MD,LOV:2 1/2 months ago,A1c:6.3,BS:83      Patient confirms h/o diabetes.  Patient denies any h/o foot wounds.  Patient has been diagnosed with neuropathy.  Risk factors: diabetes, diabetic neuropathy, PAD, h/o TIA, CKD, hyperlipidemia, h/o tobacco use in remission.  Lorenda Ishihara, MD is patient's PCP.  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 04/30/2021   Atopic dermatitis 03/30/2020   Chronic renal failure syndrome 03/30/2020   Migraine aura without headache 03/30/2020   Neuropathy 03/30/2020   Personal history of transient ischemic attack (TIA), and cerebral infarction without residual deficits 03/30/2020   Polycystic kidney disease 03/30/2020   Slow transit constipation 03/30/2020   Tension-type headache 03/30/2020   Diabetic peripheral neuropathy (HCC) 05/27/2019   Neck pain 05/27/2019   DM (diabetes mellitus) (HCC) 01/30/2018   Herpes 01/30/2018   Herpesvirus infection 03/15/2017   Past Surgical History:  Procedure Laterality Date   None     Current Outpatient Medications on File Prior to Visit  Medication Sig Dispense Refill   aspirin EC 81 MG tablet 1 tablet Orally Once a day     BINAXNOW COVID-19 AG HOME TEST KIT TEST AS DIRECTED TODAY     clotrimazole-betamethasone (LOTRISONE) cream 1 application      desloratadine (CLARINEX) 5 MG tablet Take 5 mg by mouth daily as needed.      EPINEPHrine 0.3 mg/0.3 mL IJ SOAJ injection Inject 0.3 mg into the muscle as needed.      gabapentin (NEURONTIN) 100 MG capsule TAKE 2 CAPSULE BY MOUTH EVERY DAY AS NEEDED for 30 days     Glucos-Chond-Hyal Ac-Ca Fructo (MOVE FREE JOINT HEALTH ADVANCE) TABS See admin instructions.     glucose blood (CONTOUR NEXT TEST) test strip See admin instructions.     hydrOXYzine (ATARAX/VISTARIL) 25 MG tablet 1 tablet as needed     JARDIANCE 25 MG TABS tablet Take 25 mg by mouth daily.     loratadine (CLARITIN) 10 MG tablet Take 10 mg by mouth daily.     metFORMIN (GLUCOPHAGE) 1000 MG tablet Take 1 tablet by mouth 2 (two) times daily with a meal.     Microlet Lancets MISC See admin instructions.     Omega-3 Fatty Acids (FISH OIL) 1000 MG CAPS 1 capsule     OZEMPIC, 0.25 OR 0.5 MG/DOSE, 2 MG/3ML SOPN Inject 0.5 MG Subcutaneous Once a week for 30 days     SHINGRIX injection      simvastatin (ZOCOR) 10 MG tablet Take 1 tablet by mouth daily.     traMADol (ULTRAM) 50 MG tablet Take 50 mg by mouth 3 (three) times daily.     triamcinolone acetonide (KENALOG-40) 40 MG/ML injection Inject 2 mLs into the muscle once.     No current facility-administered medications on file prior to visit.    Allergies  Allergen Reactions   Aspirin     Other reaction(s): stomach burns Other reaction(s): stomach burns, Unknown Other reaction(s): stomach burns    Omnipaque [Iohexol] Swelling    Pt states at [facility] in [place] on 05/09/2016 he swelled all over after receiving 75mL of Omnipaque 350 concentration.    Penicillamine     Other reaction(s): hallucination Other reaction(s): hallucination   Penicillins Other (See Comments)    hallucinations Other reaction(s): Other hallucinations   Social History   Occupational History   Occupation: Retired  Tobacco Use   Smoking status: Former    Types: Cigarettes   Smokeless tobacco:  Former  Building services engineer Use: Never used  Substance and Sexual Activity   Alcohol use: Not Currently   Drug use: Not Currently   Sexual activity: Not on file   Family History  Problem Relation Age of Onset   Breast cancer Mother    Stomach cancer Father    Heart disease Father    Immunization History  Administered Date(s) Administered   Fluad Quad(high Dose 65+) 10/05/2021   Influenza, High Dose Seasonal PF 09/19/2018     Review of Systems: Negative except as noted in the HPI.   Objective: Vitals:   07/11/22 1130  BP: (!) 148/81    Cullen Steff is a pleasant 72 y.o. male in NAD. AAO X 3.  Vascular Examination: Capillary refill time <3 seconds b/l. Vascular status intact b/l with palpable DP pulses; faintly palpable PT pulses. Pedal hair sparse b/l. No edema. No pain with calf compression b/l. Skin temperature gradient WNL b/l. No cyanosis or clubbing noted b/l. No ischemia or gangrene b/l.   Neurological Examination: Sensation grossly intact b/l with 10 gram monofilament. Vibratory sensation intact b/l. Pt has subjective symptoms of neuropathy.  Dermatological Examination: Pedal skin with normal turgor, texture and tone b/l.  No open wounds. No interdigital macerations.   Toenails 1-5 b/l thick, discolored, elongated with subungual debris and pain on dorsal palpation.   Porokeratotic lesion(s) submet head 1 right foot, submet head 3 left foot, and submet head 5 right foot. No erythema, no edema, no drainage, no fluctuance.  Musculoskeletal Examination: Muscle strength 5/5 to all lower extremity muscle groups bilaterally. HAV with bunion deformity noted b/l LE. Hammertoe(s) noted to the 2-5 bilaterally.  Radiographs: None  VAS [place] ABI WITH/WO TBI  Result Date: 05/18/2022  LOWER EXTREMITY DOPPLER STUDY [name]   Date of Exam:   05/18/2022 Medical Rec #: [id number]      Accession #:    [id number] Date of Birth: [birth year]      Patient Gender: M  Patient Age:   72 years Exam Location:   [facility] Procedure:        VAS [place] ABI WITH/WO TBI Referring Phys: Emilie Rutter --------------------------------------------------------------------------------    Indications: Claudication. High Risk Factors: Hypertension, hyperlipidemia, Diabetes.  Performing Technologist: Elita Quick RVT  Examination Guidelines: A complete evaluation includes at minimum, Doppler waveform signals and systolic blood pressure reading at the level of bilateral brachial, anterior tibial, and posterior tibial arteries, when vessel segments are accessible. Bilateral testing is considered an integral part of a complete examination. Photoelectric Plethysmograph (PPG) waveforms and toe systolic pressure readings are included as required and additional duplex testing as needed. Limited examinations for reoccurring indications may be performed as noted.    ABI/TBIToday's ABIToday's TBIPrevious ABIPrevious TBI  +-------+-----------+-----------+------------+------------+  Right  0.77       0.59  0.57        0.43          +-------+-----------+-----------+------------+------------+  Left   0.59       0.39       0.85        0.41          +-------+-----------+-----------+------------+------------+    Right ABIs appear increased. Left ABIs appear decreased.    Summary:   Right: Resting right ankle-brachial index indicates moderate right lower extremity arterial disease. The right toe-brachial index is abnormal.   Left: Resting left ankle-brachial index indicates moderate left lower extremity arterial disease. The left toe-brachial index is abnormal. *See table(s) above for measurements and observations.    Electronically signed by Lemar Livings MD on 05/18/2022 at 5:23:59 PM.    Final    ADA Risk Categorization: Low Risk :  Patient has all of the following: Intact protective sensation No prior foot ulcer  No severe deformity Pedal  pulses present  High Risk  Patient has one or more of the following: Loss of protective sensation Absent pedal pulses Severe Foot deformity History of foot ulcer  Assessment: 1. Pain due to onychomycosis of toenails of both feet   2. Porokeratosis   3. PAD (peripheral artery disease) (HCC)   4. Diabetic peripheral neuropathy associated with type 2 diabetes mellitus (HCC)   5. Encounter for diabetic foot exam (HCC)     Plan: -Consent given for treatment as described below: -Examined patient. -Diabetic foot examination performed today. -Continue diabetic foot care principles: inspect feet daily, monitor glucose as recommended by PCP and/or Endocrinologist, and follow prescribed diet per PCP, Endocrinologist and/or dietician. -Patient to continue soft, supportive shoe gear daily. -Toenails 1-5 b/l were debrided in length and girth with sterile nail nippers and dremel without iatrogenic bleeding.  -Porokeratotic lesion(s) submet head 1 right foot, submet head 3 left foot, and submet head 5 right foot pared and enucleated with sterile currette without incident. Total number of lesions debrided=3. -Patient/POA to call should there be question/concern in the interim. Return in about 9 weeks (around 09/12/2022).  Freddie Breech, DPM

## 2022-07-25 ENCOUNTER — Ambulatory Visit: Payer: Medicare Other | Admitting: Podiatry

## 2022-08-26 ENCOUNTER — Ambulatory Visit: Payer: Medicare HMO | Admitting: Urology

## 2022-08-26 ENCOUNTER — Encounter: Payer: Self-pay | Admitting: Urology

## 2022-08-26 VITALS — BP 131/80 | HR 84 | Ht 69.0 in | Wt 160.0 lb

## 2022-08-26 DIAGNOSIS — N2889 Other specified disorders of kidney and ureter: Secondary | ICD-10-CM

## 2022-08-26 NOTE — Progress Notes (Signed)
I, Richard Shaw, acting as a scribe for Richard Altes, MD., have documented all relevant documentation on the behalf of Richard Altes, MD, as directed by Richard Altes, MD while in the presence of Richard Altes, MD.  08/26/2022 12:04 PM   Richard Shaw 19-Apr-1950 [id number]  Referring provider: Lorenda Ishihara, MD [address] [place],  [place] [address]  Chief Complaint  Patient presents with   Follow-up    MRI results   Urologic history: 1. Renal mass MRI abdomen April 2024 with a 2.4 x 2.0 cm right upper pole renal mass.   HPI: Richard Shaw is a 72 y.o. male presents for 3 month follow-up.   Initially seen 06/13/22 for incidental renal mass and after discussing options, he elected active surveillance.  No complaints since last visit.  Denies flank, abdominal, or pelvic pain. No dysuria or gross hematuria.  Renal mass protocol MRI performed 07/14/22 showed a stable 2.1 x 2.0 cm enhancing right upper pole renal mass, which is stable.   PMH: Past Medical History:  Diagnosis Date   Chronic kidney disease    Diabetes mellitus without complication (HCC)    Hepatitis C infection 1969   Herpes simplex    History of CVA (cerebrovascular accident) 07/2017   Hyperlipidemia    Neck pain    Tension headache     Surgical History: Past Surgical History:  Procedure Laterality Date   None      Home Medications:  Allergies as of 08/26/2022       Reactions   Aspirin Other (See Comments)   Other reaction(s): stomach burns Pt can not take large quantities of aspirin   Omnipaque [iohexol] Swelling   Pt states at [facility] in [place] on 05/09/2016 he swelled all over after receiving 75mL of Omnipaque 350 concentration.    Penicillamine    Other reaction(s): hallucination Other reaction(s): hallucination   Penicillins Other (See Comments)   hallucinations Other reaction(s): Other hallucinations        Medication List         Accurate as of August 26, 2022 12:04 PM. If you have any questions, ask your nurse or doctor.          STOP taking these medications    BinaxNOW COVID-19 Ag Home Test Kit Generic drug: COVID-19 At Home Antigen Test Stopped by: Richard Shaw   clotrimazole-betamethasone cream Commonly known as: LOTRISONE Stopped by: Richard Shaw   desloratadine 5 MG tablet Commonly known as: CLARINEX Stopped by: Richard Shaw   Fish Oil 1000 MG Caps Stopped by: Richard Shaw   Move Free Joint Health Advance Tabs Stopped by: Richard Shaw   Shingrix injection Generic drug: Zoster Vaccine Adjuvanted Stopped by: Richard Shaw   triamcinolone acetonide 40 MG/ML injection Commonly known as: KENALOG-40 Stopped by: Richard Shaw       TAKE these medications    aspirin EC 81 MG tablet 1 tablet Orally Once a day   Contour Next Test test strip Generic drug: glucose blood See admin instructions.   EPINEPHrine 0.3 mg/0.3 mL Soaj injection Commonly known as: EPI-PEN Inject 0.3 mg into the muscle as needed.   gabapentin 100 MG capsule Commonly known as: NEURONTIN TAKE 2 CAPSULE BY MOUTH EVERY DAY AS NEEDED for 30 days   hydrOXYzine 25 MG tablet Commonly known as: ATARAX 1 tablet as needed   Jardiance 25 MG Tabs tablet Generic drug: empagliflozin Take 25  mg by mouth daily.   loratadine 10 MG tablet Commonly known as: CLARITIN Take 10 mg by mouth daily.   metFORMIN 1000 MG tablet Commonly known as: GLUCOPHAGE Take 1 tablet by mouth 2 (two) times daily with a meal.   Microlet Lancets Misc See admin instructions.   Ozempic (0.25 or 0.5 MG/DOSE) 2 MG/3ML Sopn Generic drug: Semaglutide(0.25 or 0.5MG /DOS) Inject 0.5 MG Subcutaneous Once a week for 30 days   simvastatin 10 MG tablet Commonly known as: ZOCOR Take 1 tablet by mouth daily.   traMADol 50 MG tablet Commonly known as: ULTRAM Take 50 mg by mouth 3 (three) times daily.        Allergies:   Allergies  Allergen Reactions   Aspirin Other (See Comments)    Other reaction(s): stomach burns Pt can not take large quantities of aspirin    Omnipaque [Iohexol] Swelling    Pt states at [facility] in [place] on 05/09/2016 he swelled all over after receiving 75mL of Omnipaque 350 concentration.    Penicillamine     Other reaction(s): hallucination Other reaction(s): hallucination   Penicillins Other (See Comments)    hallucinations Other reaction(s): Other hallucinations    Family History: Family History  Problem Relation Age of Onset   Breast cancer Mother    Stomach cancer Father    Heart disease Father     Social History:  reports that he has quit smoking. His smoking use included cigarettes. He has been exposed to tobacco smoke. He has quit using smokeless tobacco. He reports that he does not currently use alcohol. He reports that he does not currently use drugs.   Physical Exam: BP 131/80   Pulse 84   Ht 5\' 9"  (1.753 m)   Wt 160 lb (72.6 kg)   BMI 23.63 kg/m   Constitutional:  Alert and oriented, No acute distress. HEENT: [place] AT, moist mucus membranes.  Trachea midline, no masses. Cardiovascular: No clubbing, cyanosis, or edema. Respiratory: Normal respiratory effort, no increased work of breathing. GI: Abdomen is soft, nontender, nondistended, no abdominal masses Skin: No rashes, bruises or suspicious lesions. Neurologic: Grossly intact, no focal deficits, moving all 4 extremities. Psychiatric: Normal mood and affect.  Pertinent Imaging: MRI was personally reviewed and interpreted.  MRI EXAM: MRI ABDOMEN WITHOUT AND WITH CONTRAST   TECHNIQUE: Multiplanar multisequence MR imaging of the abdomen was performed both before and after the administration of intravenous contrast.   CONTRAST:  7 mL Vueway IV   COMPARISON:  MRI abdomen dated 05/26/2022. CT abdomen/pelvis dated 07/04/2018.   FINDINGS: Lower chest: Lung bases are clear.    Hepatobiliary: Liver is within normal limits. No suspicious/enhancing hepatic lesions.   Layering gallbladder sludge versus tiny gallstones. No gallbladder wall thickening or pericholecystic fluid.   No intrahepatic or extrahepatic duct dilatation.   Pancreas:  Within normal limits.   Spleen:  Within normal limits.   Adrenals/Urinary Tract:  Adrenal glands are within normal limits.   2.1 x 2.0 cm enhancing lesion in the posterior right upper kidney (series 10/image 40), compatible with solid renal neoplasm such as renal cell carcinoma. This is grossly unchanged from the prior MRI.   15 mm irregular cyst in the posterior right lower kidney (series 4/image 25), corresponding to the vague lesion on remote prior CT, benign (Bosniak I). Additional subcentimeter simple cysts bilaterally. No follow-up is recommended. No hydronephrosis.   Stomach/Bowel: Stomach is within normal limits.   Visualized bowel is grossly unremarkable.   Vascular/Lymphatic: No evidence of  abdominal aortic aneurysm. Single right renal artery and vein. No renal vein invasion.   No suspicious abdominal lymphadenopathy.   Other:  No abdominal ascites.   Musculoskeletal: No focal osseous lesions.   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   Single right renal artery and vein. No renal vein invasion. No regional lymphadenopathy or metastatic disease.     Electronically Signed   By: Charline Bills M.D.   On: 07/21/2022 00:51  Assessment & Plan:    1. Right renal mass Stable renal mass on MRI I did discuss with Mr. 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 However, surveillance is an acceptable option. Other options were again reviewed including renal mass biopsy, ablative percutaneous therapies by interventional radiology and surgical excision.  He  desires to continue surveillance and will schedule a follow up MRI in 6 months.  [facility] [address], [address] [place], [place] [address] [phone]

## 2022-08-28 ENCOUNTER — Encounter: Payer: Self-pay | Admitting: Urology

## 2022-09-12 ENCOUNTER — Ambulatory Visit (INDEPENDENT_AMBULATORY_CARE_PROVIDER_SITE_OTHER): Payer: Medicare Other | Admitting: Podiatry

## 2022-09-12 DIAGNOSIS — Z91199 Patient's noncompliance with other medical treatment and regimen due to unspecified reason: Secondary | ICD-10-CM

## 2022-09-12 NOTE — Progress Notes (Signed)
1. No-show for appointment     

## 2022-09-15 ENCOUNTER — Ambulatory Visit (INDEPENDENT_AMBULATORY_CARE_PROVIDER_SITE_OTHER): Payer: Medicare HMO | Admitting: Podiatry

## 2022-09-15 ENCOUNTER — Encounter: Payer: Self-pay | Admitting: Podiatry

## 2022-09-15 DIAGNOSIS — M79674 Pain in right toe(s): Secondary | ICD-10-CM | POA: Diagnosis not present

## 2022-09-15 DIAGNOSIS — E1142 Type 2 diabetes mellitus with diabetic polyneuropathy: Secondary | ICD-10-CM | POA: Diagnosis not present

## 2022-09-15 DIAGNOSIS — B351 Tinea unguium: Secondary | ICD-10-CM

## 2022-09-15 DIAGNOSIS — Q828 Other specified congenital malformations of skin: Secondary | ICD-10-CM

## 2022-09-15 DIAGNOSIS — M79675 Pain in left toe(s): Secondary | ICD-10-CM | POA: Diagnosis not present

## 2022-09-15 DIAGNOSIS — I739 Peripheral vascular disease, unspecified: Secondary | ICD-10-CM

## 2022-09-19 NOTE — Progress Notes (Signed)
Subjective:  Patient ID: Richard Shaw, male    DOB: 08-Apr-1950,  MRN: [id number]  Richard Shaw presents to clinic today for at risk footcare. Patient has h/o diabetes, neuropathy and PAD and is seen for  and painful porokeratotic lesion(s) of both feet and painful mycotic toenails that limit ambulation. Painful toenails interfere with ambulation. Aggravating factors include wearing enclosed shoe gear. Pain is relieved with periodic professional debridement. Painful porokeratotic lesions are aggravated when weightbearing with and without shoegear. Pain is relieved with periodic professional debridement.  Chief Complaint  Patient presents with   Nail Problem    DFC,A1C:per patient 5.9,BS; checks weekly and it was 130,Referring Provider Lorenda Ishihara, MD,lov:08/24-seen nurse practioner      New problem(s): None.   PCP is Lorenda Ishihara, MD.  Allergies  Allergen Reactions   Aspirin Other (See Comments)    Other reaction(s): stomach burns Pt can not take large quantities of aspirin    Omnipaque [Iohexol] Swelling    Pt states at [facility] in [place] on 05/09/2016 he swelled all over after receiving 75mL of Omnipaque 350 concentration.    Penicillamine     Other reaction(s): hallucination Other reaction(s): hallucination   Penicillins Other (See Comments)    hallucinations Other reaction(s): Other hallucinations    Review of Systems: Negative except as noted in the HPI.  Objective: No changes noted in today's physical examination. There were no vitals filed for this visit. Richard Shaw is a pleasant 72 y.o. male WD, WN in NAD. AAO x 3.  Vascular Examination: Capillary refill time <3 seconds b/l. Vascular status intact b/l with palpable DP pulses; faintly palpable PT pulses. Pedal hair sparse b/l. No edema. No pain with calf compression b/l. Skin temperature gradient WNL b/l. No cyanosis or clubbing noted b/l. No ischemia or gangrene b/l.   Neurological  Examination: Sensation grossly intact b/l with 10 gram monofilament. Vibratory sensation intact b/l. Pt has subjective symptoms of neuropathy.  Dermatological Examination: Pedal skin with normal turgor, texture and tone b/l.  No open wounds. No interdigital macerations.   Toenails 1-5 b/l thick, discolored, elongated with subungual debris and pain on dorsal palpation.   Porokeratotic lesion(s) submet head 1 right foot, submet head 3 left foot, and submet head 5 right foot. No erythema, no edema, no drainage, no fluctuance.  Musculoskeletal Examination: Muscle strength 5/5 to all lower extremity muscle groups bilaterally. HAV with bunion deformity noted b/l LE. Hammertoe(s) noted to the 2-5 bilaterally.  Radiographs: None  ABIs: 05/18/2022 ABI/TBIToday's ABIToday's TBIPrevious ABIPrevious TBI  +-------+-----------+-----------+------------+------------+  Right  0.77       0.59       0.57        0.43          +-------+-----------+-----------+------------+------------+  Left   0.59       0.39       0.85        0.41          +-------+-----------+-----------+------------+------------+    Right ABIs appear increased. Left ABIs appear decreased.    Summary:   Right: Resting right ankle-brachial index indicates moderate right lower extremity arterial disease. The right toe-brachial index is abnormal.   Left: Resting left ankle-brachial index indicates moderate left lower extremity arterial disease. The left toe-brachial index is abnormal. *See table(s) above for measurements and observations.    Electronically signed by Lemar Livings MD on 05/18/2022 at 5:23:59 PM.    Final    Assessment/Plan: 1. Pain due to onychomycosis of toenails of  both feet   2. Porokeratosis   3. PAD (peripheral artery disease) (HCC)   4. Diabetic peripheral neuropathy associated with type 2 diabetes mellitus (HCC)     -Patient was evaluated and treated. All patient's and/or POA's  questions/concerns answered on today's visit. -Continue foot and shoe inspections daily. Monitor blood glucose per PCP/Endocrinologist's recommendations. -Patient to continue soft, supportive shoe gear daily. -Mycotic toenails 1-5 bilaterally were debrided in length and girth with sterile nail nippers and dremel without incident. -Porokeratotic lesion(s) submet head 1 right foot, submet head 3 left foot, and submet head 5 right foot pared and enucleated with sterile currette. Total number of lesions debrided=3. Pinpoint bleeding submet head 5 right foot addressed with Lumicain Hemostatic Solution. Cleansed with alcohol and TAO/band-aid applied. He may remove on tomorrow. No further treatment required. Call office if he has any problems. -Patient/POA to call should there be question/concern in the interim.   Return in about 9 weeks (around 11/17/2022).  Freddie Breech, DPM

## 2022-10-25 ENCOUNTER — Ambulatory Visit: Payer: Medicare Other | Admitting: Podiatry

## 2022-11-14 ENCOUNTER — Ambulatory Visit (INDEPENDENT_AMBULATORY_CARE_PROVIDER_SITE_OTHER): Payer: Medicare HMO | Admitting: Podiatry

## 2022-11-14 DIAGNOSIS — M216X1 Other acquired deformities of right foot: Secondary | ICD-10-CM | POA: Diagnosis not present

## 2022-11-14 DIAGNOSIS — M216X2 Other acquired deformities of left foot: Secondary | ICD-10-CM | POA: Diagnosis not present

## 2022-11-14 DIAGNOSIS — E1142 Type 2 diabetes mellitus with diabetic polyneuropathy: Secondary | ICD-10-CM | POA: Diagnosis not present

## 2022-11-21 ENCOUNTER — Encounter: Payer: Self-pay | Admitting: Podiatry

## 2022-11-21 NOTE — Progress Notes (Signed)
Subjective:  Patient ID: Richard Shaw, male    DOB: 1951-01-27,  MRN: [id number]  Richard Shaw presents to clinic today for  painful calluses of both feet. Patient states padding placed in both shoes helps relieve pain.   Chief Complaint  Patient presents with   Diabetes    A1C-5.9 Pcpv-09/2022   New problem(s): None.   PCP is Lorenda Ishihara, MD.  Allergies  Allergen Reactions   Aspirin Other (See Comments)    Other reaction(s): stomach burns Pt can not take large quantities of aspirin    Omnipaque [Iohexol] Swelling    Pt states at [facility] in [place] on 05/09/2016 he swelled all over after receiving 75mL of Omnipaque 350 concentration.    Penicillamine     Other reaction(s): hallucination Other reaction(s): hallucination   Penicillins Other (See Comments)    hallucinations Other reaction(s): Other hallucinations    Review of Systems: Negative except as noted in the HPI.  Objective: No changes noted in today's physical examination. There were no vitals filed for this visit. Richard Shaw is a pleasant 72 y.o. male WD, WN in NAD. AAO x 3.  Vascular Examination: Capillary refill time <3 seconds b/l. Vascular status intact b/l with palpable DP pulses; faintly palpable PT pulses. Pedal hair sparse b/l. No edema. No pain with calf compression b/l. Skin temperature gradient WNL b/l. No cyanosis or clubbing noted b/l. No ischemia or gangrene b/l.   Neurological Examination: Sensation grossly intact b/l with 10 gram monofilament. Vibratory sensation intact b/l. Pt has subjective symptoms of neuropathy.  Dermatological Examination: Pedal skin with normal turgor, texture and tone b/l.  No open wounds. No interdigital macerations.   Toenails 1-5 b/l thick, discolored, elongated with subungual debris and pain on dorsal palpation.   Porokeratotic lesion(s) submet head 2 left foot, and submet head 5 b/l. No erythema, no edema, no drainage, no  fluctuance.  Musculoskeletal Examination: Muscle strength 5/5 to all lower extremity muscle groups bilaterally. HAV with bunion deformity noted b/l LE. Hammertoe(s) noted to the 2-5 bilaterally.  Radiographs: None  Assessment/Plan: 1. Plantarflexion deformity of both feet   2. Diabetic peripheral neuropathy associated with type 2 diabetes mellitus (HCC)    -Consent given for treatment as described below: -Examined patient. -Provided dancers pad to both insoles to offload lesions. Patient noted relief. Toenails debrided as a courtesy.. -Patient to continue soft, supportive shoe gear daily. -As a courtesy, porokeratotic lesion(s) of both feet pared and enucleated without complication or incident. Total number pared=3. -Patient/POA to call should there be question/concern in the interim.   Return in about 9 weeks (around 01/16/2023).  Freddie Breech, DPM

## 2023-01-04 ENCOUNTER — Encounter: Payer: Self-pay | Admitting: Urology

## 2023-01-05 NOTE — Telephone Encounter (Signed)
Talked with patient and he was saying that he was going to look into that mass on his kidney . He think it came from here he worked in [employer] for years. He will talk more at his appt.

## 2023-01-16 ENCOUNTER — Ambulatory Visit: Payer: Medicare HMO | Admitting: Podiatry

## 2023-01-20 ENCOUNTER — Ambulatory Visit (INDEPENDENT_AMBULATORY_CARE_PROVIDER_SITE_OTHER): Payer: Medicare HMO | Admitting: Podiatry

## 2023-01-20 DIAGNOSIS — M79675 Pain in left toe(s): Secondary | ICD-10-CM | POA: Diagnosis not present

## 2023-01-20 DIAGNOSIS — I739 Peripheral vascular disease, unspecified: Secondary | ICD-10-CM

## 2023-01-20 DIAGNOSIS — B351 Tinea unguium: Secondary | ICD-10-CM | POA: Diagnosis not present

## 2023-01-20 DIAGNOSIS — Q828 Other specified congenital malformations of skin: Secondary | ICD-10-CM | POA: Diagnosis not present

## 2023-01-20 DIAGNOSIS — M79674 Pain in right toe(s): Secondary | ICD-10-CM | POA: Diagnosis not present

## 2023-01-20 DIAGNOSIS — E1142 Type 2 diabetes mellitus with diabetic polyneuropathy: Secondary | ICD-10-CM

## 2023-01-26 ENCOUNTER — Encounter: Payer: Self-pay | Admitting: Urology

## 2023-01-26 NOTE — Telephone Encounter (Signed)
[facility] radiology called stating pt needs prednisone due allergy to MRI contact

## 2023-01-27 ENCOUNTER — Ambulatory Visit: Payer: Medicare Other | Admitting: Podiatry

## 2023-01-27 MED ORDER — PREDNISONE 50 MG PO TABS: ORAL_TABLET | ORAL | 0 refills | Status: AC

## 2023-01-27 MED ORDER — DIPHENHYDRAMINE HCL 50 MG PO TABS: ORAL_TABLET | ORAL | 0 refills | Status: AC

## 2023-01-27 NOTE — Telephone Encounter (Signed)
Prednisone and Benadryl sent in to the pharmacy and patient advised.

## 2023-01-29 ENCOUNTER — Encounter: Payer: Self-pay | Admitting: Podiatry

## 2023-01-29 NOTE — Progress Notes (Signed)
Subjective:  Patient ID: Richard Shaw, male    DOB: 1950/10/01,  MRN: [id number]  72 y.o. male presents to clinic with  at risk footcare. Patient has h/o diabetes, neuropathy and PAD and is seen for  and painful porokeratotic lesion(s) both feet and painful mycotic toenails that limit ambulation. Painful toenails interfere with ambulation. Aggravating factors include wearing enclosed shoe gear. Pain is relieved with periodic professional debridement. Painful porokeratotic lesions are aggravated when weightbearing with and without shoegear. Pain is relieved with periodic professional debridement.   New problem(s): None   PCP is Lorenda Ishihara, MD.  Allergies  Allergen Reactions   Aspirin Other (See Comments)    Other reaction(s): stomach burns Pt can not take large quantities of aspirin    Iodinated Contrast Media     Other Reaction(s): hives/itching/ ok if takes Benadryl   Omnipaque [Iohexol] Swelling    Pt states at [facility] in [place] on 05/09/2016 he swelled all over after receiving 75mL of Omnipaque 350 concentration.    Penicillamine     Other reaction(s): hallucination Other reaction(s): hallucination   Penicillins Other (See Comments)    hallucinations Other reaction(s): Other hallucinations    Review of Systems: Negative except as noted in the HPI.   Objective:  Richard Shaw is a pleasant 72 y.o. male WD, WN in NAD.[name] AAO x 3.  Vascular Examination: Vascular status intact b/l with palpable pedal pulses. CFT immediate b/l. No edema. No pain with calf compression b/l. Skin temperature gradient WNL b/l.   Neurological Examination: Sensation grossly intact b/l with 10 gram monofilament. Vibratory sensation intact b/l. Pt has subjective symptoms of neuropathy.  Dermatological Examination: Pedal skin with normal turgor, texture and tone b/l. Toenails 1-5 b/l thick, discolored, elongated with subungual debris and pain on dorsal palpation. Porokeratotic  lesion(s) submet head 1 right foot, submet head 3 left foot, and submet head 5 right foot. No erythema, no edema, no drainage, no fluctuance.  Musculoskeletal Examination: Muscle strength 5/5 to b/l LE. Muscle strength 5/5 to all lower extremity muscle groups bilaterally. HAV with bunion bilaterally and hammertoes 2-5 b/l.  Radiographs: None  Last A1c:       No data to display           Assessment:   1. Pain due to onychomycosis of toenails of both feet   2. Porokeratosis   3. PAD (peripheral artery disease) (HCC)   4. Diabetic peripheral neuropathy associated with type 2 diabetes mellitus (HCC)    Plan:  -Consent given for treatment as described below: -Examined patient. -Continue foot and shoe inspections daily. Monitor blood glucose per PCP/Endocrinologist's recommendations. -Toenails 1-5 b/l were debrided in length and girth with sterile nail nippers and dremel without iatrogenic bleeding.  -Porokeratotic lesion(s) submet head 1 right foot, submet head 3 left foot, and submet head 5 right foot pared and enucleated with sterile currette without incident. Total number of lesions debrided=3. -Patient/POA to call should there be question/concern in the interim.  Return in about 9 weeks (around 03/24/2023).  Freddie Breech, DPM      [facility] LOCATION: [address], [place] [address]  Office [phone]   [facility] LOCATION: [address] [place], [place] [address] Office [phone]

## 2023-02-02 ENCOUNTER — Ambulatory Visit: Admission: RE | Admit: 2023-02-02 | Payer: Medicare HMO | Source: Ambulatory Visit

## 2023-02-09 ENCOUNTER — Ambulatory Visit
Admission: RE | Admit: 2023-02-09 | Discharge: 2023-02-09 | Disposition: A | Discharge status: Home / Self Care | Payer: Medicare HMO | Source: Ambulatory Visit | Attending: Urology | Admitting: Urology

## 2023-02-09 DIAGNOSIS — N2889 Other specified disorders of kidney and ureter: Secondary | ICD-10-CM | POA: Diagnosis present

## 2023-02-09 MED ORDER — GADOBUTROL 1 MMOL/ML IV SOLN
7.0000 mL | Freq: Once | INTRAVENOUS | Status: AC | PRN
  Administered 2023-02-09: 7 mL via INTRAVENOUS

## 2023-03-02 ENCOUNTER — Ambulatory Visit (INDEPENDENT_AMBULATORY_CARE_PROVIDER_SITE_OTHER): Payer: Medicare HMO | Admitting: Urology

## 2023-03-02 ENCOUNTER — Encounter: Payer: Self-pay | Admitting: Urology

## 2023-03-02 VITALS — BP 152/86 | HR 80 | Ht 69.0 in | Wt 161.0 lb

## 2023-03-02 DIAGNOSIS — N2889 Other specified disorders of kidney and ureter: Secondary | ICD-10-CM

## 2023-03-02 NOTE — Progress Notes (Signed)
I, Maysun Anabel Bene, acting as a scribe for Riki Altes, MD., have documented all relevant documentation on the behalf of Riki Altes, MD, as directed by Riki Altes, MD while in the presence of Riki Altes, MD.  03/02/2023 4:10 PM   Richard Shaw [birth year] [id number]  Referring provider: Lorenda Ishihara, MD [address] [place],  [place] [address]  Chief Complaint  Patient presents with   Follow-up   Urologic history: 1. Renal mass MRI abdomen April 2024 with a 2.4 x 2.0 cm right upper pole renal mass.   HPI: Richard Shaw is a 73 y.o. male presents for follow-up of a small renal mass.  Initially seen 06/13/22 for incidental renal mass and after discussing options, he elected active surveillance.  No complaints since last visit.  Denies flank, abdominal, or pelvic pain. No dysuria or gross hematuria.  Follow-up MRI abdomen with and without contrast performed 02/09/23 shows a stable 2.1 x 2 cm enhancing upper pole right renal mass.   PMH: Past Medical History:  Diagnosis Date   Chronic kidney disease    Diabetes mellitus without complication (HCC)    Hepatitis C infection 1969   Herpes simplex    History of CVA (cerebrovascular accident) 07/2017   Hyperlipidemia    Neck pain    Tension headache     Surgical History: Past Surgical History:  Procedure Laterality Date   None      Home Medications:  Allergies as of 03/02/2023       Reactions   Aspirin Other (See Comments)   Other reaction(s): stomach burns Pt can not take large quantities of aspirin   Iodinated Contrast Media    Other Reaction(s): hives/itching/ ok if takes Benadryl   Omnipaque [iohexol] Swelling   Pt states at [facility] in [place] on 05/09/2016 he swelled all over after receiving 75mL of Omnipaque 350 concentration.    Penicillamine    Other reaction(s): hallucination Other reaction(s): hallucination   Penicillins Other (See Comments)    hallucinations Other reaction(s): Other hallucinations        Medication List        Accurate as of March 02, 2023  4:10 PM. If you have any questions, ask your nurse or doctor.          STOP taking these medications    gabapentin 100 MG capsule Commonly known as: NEURONTIN Stopped by: Riki Altes   glipiZIDE 10 MG tablet Commonly known as: GLUCOTROL Stopped by: Riki Altes       TAKE these medications    aspirin EC 81 MG tablet 1 tablet Orally Once a day   Contour Next Test test strip Generic drug: glucose blood See admin instructions.   diphenhydrAMINE 50 MG tablet Commonly known as: BENADRYL Take 1 tablet 1 hour prior to the scan   EPINEPHrine 0.3 mg/0.3 mL Soaj injection Commonly known as: EPI-PEN Inject 0.3 mg into the muscle as needed.   famciclovir 500 MG tablet Commonly known as: FAMVIR Take by mouth.   hydrOXYzine 25 MG tablet Commonly known as: ATARAX 1 tablet as needed   Jardiance 25 MG Tabs tablet Generic drug: empagliflozin Take 25 mg by mouth daily.   loratadine 10 MG tablet Commonly known as: CLARITIN Take 10 mg by mouth daily.   metFORMIN 1000 MG tablet Commonly known as: GLUCOPHAGE Take 1 tablet by mouth 2 (two) times daily with a meal.   Microlet Lancets Misc See admin instructions.  Ozempic (0.25 or 0.5 MG/DOSE) 2 MG/3ML Sopn Generic drug: Semaglutide(0.25 or 0.5MG /DOS) Inject 0.5 MG Subcutaneous Once a week for 30 days   predniSONE 50 MG tablet Commonly known as: DELTASONE Take 1 tablet by mouth 13 hours, 7 hours  and 1 hour prior to the scan   simvastatin 10 MG tablet Commonly known as: ZOCOR Take 1 tablet by mouth daily.        Allergies:  Allergies  Allergen Reactions   Aspirin Other (See Comments)    Other reaction(s): stomach burns Pt can not take large quantities of aspirin    Iodinated Contrast Media     Other Reaction(s): hives/itching/ ok if takes Benadryl   Omnipaque [Iohexol]  Swelling    Pt states at [facility] in [place] on 05/09/2016 he swelled all over after receiving 75mL of Omnipaque 350 concentration.    Penicillamine     Other reaction(s): hallucination Other reaction(s): hallucination   Penicillins Other (See Comments)    hallucinations Other reaction(s): Other hallucinations    Family History: Family History  Problem Relation Age of Onset   Breast cancer Mother    Stomach cancer Father    Heart disease Father     Social History:  reports that he has quit smoking. His smoking use included cigarettes. He has been exposed to tobacco smoke. He has quit using smokeless tobacco. He reports that he does not currently use alcohol. He reports that he does not currently use drugs.   Physical Exam: BP (!) 152/86   Pulse 80   Ht 5\' 9"  (1.753 m)   Wt 161 lb (73 kg)   BMI 23.78 kg/m   Constitutional:  Alert and oriented, No acute distress. HEENT: [place] AT Respiratory: Normal respiratory effort, no increased work of breathing. Psychiatric: Normal mood and affect.   Assessment & Plan:    1. Right renal mass Stable renal mass on MRI We again discussed the 70-75% probability this represents a small renal cell carcinoma. Management options were again reviewed including renal mass biopsy, ablated percutaneous therapies, and surgical excision.  He elected to continue active surveillance and will schedule a follow-up MRI in 9 months.  He was concerned this mass was secondary to exposure to toxins at his previous workplace. He states when he was recently in [place], he obtained scrapings of the material covering the walls that he was concerned as a potential toxin and was inquiring about getting this tested. I informed him that we do not have the capability to do testing, and this would need to be done by environmental lab. I informed him I would check to see if there are any places in the area that could potentially do this testing for him.   I have  reviewed the above documentation for accuracy and completeness, and I agree with the above.   Riki Altes, MD  [facility] [address] [place], [place] [address] [phone]

## 2023-03-20 ENCOUNTER — Ambulatory Visit: Payer: Medicare HMO | Admitting: Podiatry

## 2023-03-27 ENCOUNTER — Encounter: Payer: Self-pay | Admitting: Podiatry

## 2023-03-27 ENCOUNTER — Ambulatory Visit (INDEPENDENT_AMBULATORY_CARE_PROVIDER_SITE_OTHER): Payer: Medicare HMO | Admitting: Podiatry

## 2023-03-27 VITALS — Ht 69.0 in | Wt 161.0 lb

## 2023-03-27 DIAGNOSIS — M79675 Pain in left toe(s): Secondary | ICD-10-CM | POA: Diagnosis not present

## 2023-03-27 DIAGNOSIS — Q828 Other specified congenital malformations of skin: Secondary | ICD-10-CM

## 2023-03-27 DIAGNOSIS — I739 Peripheral vascular disease, unspecified: Secondary | ICD-10-CM | POA: Diagnosis not present

## 2023-03-27 DIAGNOSIS — M79674 Pain in right toe(s): Secondary | ICD-10-CM | POA: Diagnosis not present

## 2023-03-27 DIAGNOSIS — B351 Tinea unguium: Secondary | ICD-10-CM | POA: Diagnosis not present

## 2023-03-27 DIAGNOSIS — E1142 Type 2 diabetes mellitus with diabetic polyneuropathy: Secondary | ICD-10-CM | POA: Diagnosis not present

## 2023-03-27 NOTE — Progress Notes (Signed)
 Subjective:  Patient ID: Richard Shaw, male    DOB: 06-17-1950,  MRN: [id number]  Richard Shaw presents to clinic today for at risk foot care. Pt has h/o NIDDM with neuropathy and PAD and is seen for painful porokeratotic lesion(s) of both feet and painful mycotic toenails that limit ambulation. Painful toenails interfere with ambulation. Aggravating factors include wearing enclosed shoe gear. Pain is relieved with periodic professional debridement. Painful porokeratotic lesions are aggravated when weightbearing with and without shoegear. Pain is relieved with periodic professional debridement. Patient states lesions are better with offloading dancer's pads placed in his Skechers. He states he has had COVID since his last visit here. He has recovered. Chief Complaint  Patient presents with   Nail Problem    Pt is here for [facility] last A1C was 7.9 PCP is Dr Chales Salmon and LOV was in December.   New problem(s): None.   PCP is Lorenda Ishihara, MD.  Allergies  Allergen Reactions   Aspirin Other (See Comments)    Other reaction(s): stomach burns Pt can not take large quantities of aspirin    Iodinated Contrast Media     Other Reaction(s): hives/itching/ ok if takes Benadryl   Omnipaque [Iohexol] Swelling    Pt states at [facility] in [place] on 05/09/2016 he swelled all over after receiving 75mL of Omnipaque 350 concentration.    Penicillamine     Other reaction(s): hallucination Other reaction(s): hallucination   Penicillins Other (See Comments)    hallucinations Other reaction(s): Other hallucinations    Review of Systems: Negative except as noted in the HPI.  Objective: No changes noted in today's physical examination. There were no vitals filed for this visit. Richard Shaw is a pleasant 73 y.o. male WD, WN in NAD. AAO x 3.  Vascular Examination: Vascular status intact b/l with palpable pedal pulses. CFT immediate b/l. No edema. No pain with calf compression b/l. Skin  temperature gradient WNL b/l.   Neurological Examination: Sensation grossly intact b/l with 10 gram monofilament. Vibratory sensation intact b/l. Pt has subjective symptoms of neuropathy.  Dermatological Examination: Pedal skin with normal turgor, texture and tone b/l. Toenails 1-5 b/l thick, discolored, elongated with subungual debris and pain on dorsal palpation. Porokeratotic lesion(s) submet head 1 right foot, submet head 3 left foot, and submet head 5 right foot. No erythema, no edema, no drainage, no fluctuance.  Musculoskeletal Examination: Muscle strength 5/5 to b/l LE. Muscle strength 5/5 to all lower extremity muscle groups bilaterally. HAV with bunion bilaterally and hammertoes 2-5 b/l.  Radiographs: None  Assessment/Plan: 1. Pain due to onychomycosis of toenails of both feet   2. Porokeratosis   3. PAD (peripheral artery disease) (HCC)   4. Diabetic peripheral neuropathy associated with type 2 diabetes mellitus (HCC)     Patient was evaluated and treated. All patient's and/or POA's questions/concerns addressed on today's visit. Mycotic toenails 1-5 debrided in length and girth without incident. Porokeratotic lesion(s) submet head 1 right foot, submet head 3 left foot, and submet head 5 right foot pared with sharp debridement without incident. Continue daily foot inspections and monitor blood glucose per PCP/Endocrinologist's recommendations. Continue soft, supportive shoe gear daily. Report any pedal injuries to medical professional. Call office if there are any questions/concerns. -Patient/POA to call should there be question/concern in the interim.   Return in about 9 weeks (around 05/29/2023).  Freddie Breech, DPM       LOCATION: [address]  [place], [place] [address]                   Office [phone]   [facility] LOCATION: [address] [place], [place] [address] Office [phone]

## 2023-04-21 ENCOUNTER — Telehealth: Payer: Self-pay

## 2023-04-21 NOTE — Telephone Encounter (Signed)
 Patient advised. Patient stated he has not had any luck in finding a lab either but will keep looking and will let us know if he finds out anymore information

## 2023-04-21 NOTE — Telephone Encounter (Signed)
 Patient left message on triage line stating he was seen on 03/02/2023 and discussed with Dr Lonna Cobb potentially testing materials he has from his previous job that may have toxins that contributed to his renal mass and that we were suppose to get back in touch with him to let him know if there were laboratory locally that would do that testing. He has not has heard from [place] and wanted to follow up.

## 2023-04-21 NOTE — Telephone Encounter (Signed)
 I was unable to find any labs that do industrial testing in the area, only medical labs.

## 2023-05-29 ENCOUNTER — Encounter: Payer: Self-pay | Admitting: Podiatry

## 2023-05-29 ENCOUNTER — Ambulatory Visit (INDEPENDENT_AMBULATORY_CARE_PROVIDER_SITE_OTHER): Payer: Medicare HMO | Admitting: Podiatry

## 2023-05-29 VITALS — Ht 69.0 in | Wt 161.0 lb

## 2023-05-29 DIAGNOSIS — M79674 Pain in right toe(s): Secondary | ICD-10-CM

## 2023-05-29 DIAGNOSIS — Q828 Other specified congenital malformations of skin: Secondary | ICD-10-CM | POA: Diagnosis not present

## 2023-05-29 DIAGNOSIS — I739 Peripheral vascular disease, unspecified: Secondary | ICD-10-CM

## 2023-05-29 DIAGNOSIS — M79675 Pain in left toe(s): Secondary | ICD-10-CM

## 2023-05-29 DIAGNOSIS — B351 Tinea unguium: Secondary | ICD-10-CM | POA: Diagnosis not present

## 2023-05-29 DIAGNOSIS — E1142 Type 2 diabetes mellitus with diabetic polyneuropathy: Secondary | ICD-10-CM

## 2023-05-29 NOTE — Progress Notes (Signed)
 Subjective:  Patient ID: Richard Shaw, male    DOB: 07/21/1950,  MRN: [id number]  73 y.o. male presents to clinic with  at risk footcare. Patient has h/o diabetes, neuropathy and PAD and is seen for  and painful thick toenails that are difficult to trim. Pain interferes with ambulation. Aggravating factors include wearing enclosed shoe gear. Pain is relieved with periodic professional debridement.  Chief Complaint  Patient presents with   Nail Problem    Pt is here for [facility] last A1C was 7.3 PCP is Dr Varadarjan and LOV was last year.    New problem(s): None   PCP is Arva Lathe, MD.  Allergies  Allergen Reactions   Aspirin Other (See Comments)    Other reaction(s): stomach burns Pt can not take large quantities of aspirin    Iodinated Contrast Media     Other Reaction(s): hives/itching/ ok if takes Benadryl    Omnipaque [Iohexol] Swelling    Pt states at [facility] in [place] on 05/09/2016 he swelled all over after receiving 75mL of Omnipaque 350 concentration.    Penicillamine     Other reaction(s): hallucination Other reaction(s): hallucination   Penicillins Other (See Comments)    hallucinations Other reaction(s): Other hallucinations    Review of Systems: Negative except as noted in the HPI.   Objective:  Richard Shaw is a pleasant 73 y.o. male WD, WN in NAD. AAO x 3.  Vascular Examination: Vascular status intact b/l with palpable pedal pulses. CFT immediate b/l. No edema. No pain with calf compression b/l. Skin temperature gradient WNL b/l. No ischemia or gangrene noted b/l lower extremities. No cyanosis or clubbing noted b/l LE.  Neurological Examination: Sensation grossly intact b/l with 10 gram monofilament. Vibratory sensation intact b/l. Pt has subjective symptoms of neuropathy.  Dermatological Examination: Pedal skin with normal turgor, texture and tone b/l. Toenails 1-5 b/l thick, discolored, elongated with subungual debris and pain on dorsal  palpation. Porokeratotic lesion(s) submet head 1 right foot, submet head 3 left foot, and submet head 5 right foot. No erythema, no edema, no drainage, no fluctuance.  Musculoskeletal Examination: Muscle strength 5/5 to b/l LE. HAV with bunion deformity noted b/l LE. Hammertoe deformity noted 2-5 b/l.  Radiographs: None  Last A1c:       No data to display           Assessment:   1. Pain due to onychomycosis of toenails of both feet   2. Porokeratosis   3. PAD (peripheral artery disease) (HCC)   4. Diabetic peripheral neuropathy associated with type 2 diabetes mellitus (HCC)    Plan:  Consent given for treatment. Patient examined. All patient's and/or POA's questions/concerns addressed on today's visit. Toenails 1-5 debrided in length and girth without incident. Porokeratotic lesion(s) submet head 1 right foot, submet head 3 left foot, and submet head 5 right foot pared and enucleated with sharp debridement without incident. Continue foot and shoe inspections daily. Monitor blood glucose per PCP/Endocrinologist's recommendations.Continue soft, supportive shoe gear daily. Report any pedal injuries to medical professional. Call office if there are any questions/concerns. -Patient/POA to call should there be question/concern in the interim.  Return in about 9 weeks (around 07/31/2023).  Richard Shaw, DPM      [place] LOCATION: [address]  [place], [place] [address]                   Office [phone]   [facility] LOCATION: [address] [place], [place] [address] Office [phone]

## 2023-06-09 ENCOUNTER — Other Ambulatory Visit: Payer: Self-pay | Admitting: *Deleted

## 2023-06-09 DIAGNOSIS — I70211 Atherosclerosis of native arteries of extremities with intermittent claudication, right leg: Secondary | ICD-10-CM

## 2023-06-12 NOTE — Progress Notes (Shared)
 [facility] - Clinic Note  06/21/2023   CHIEF COMPLAINT Patient presents for No chief complaint on file.  HISTORY OF PRESENT ILLNESS: Richard Shaw is a 73 y.o. male who presents to the clinic today for:   Referring physician: Arva Lathe, MD [address] [place],  [place] [address]  HISTORICAL INFORMATION:  Selected notes from the MEDICAL RECORD NUMBER Referred by Dr. Merlyn Starring:  Ocular Hx- PMH-   CURRENT MEDICATIONS: No current outpatient medications on file. (Ophthalmic Drugs)   No current facility-administered medications for this visit. (Ophthalmic Drugs)   Current Outpatient Medications (Other)  Medication Sig   diphenhydrAMINE  (BENADRYL ) 50 MG tablet Take 1 tablet 1 hour prior to the scan   EPINEPHrine 0.3 mg/0.3 mL IJ SOAJ injection Inject 0.3 mg into the muscle as needed.    famciclovir (FAMVIR) 500 MG tablet Take by mouth.   glucose blood (CONTOUR NEXT TEST) test strip See admin instructions.   hydrOXYzine (ATARAX/VISTARIL) 25 MG tablet 1 tablet as needed   JARDIANCE 25 MG TABS tablet Take 25 mg by mouth daily.   loratadine (CLARITIN) 10 MG tablet Take 10 mg by mouth daily.   metFORMIN (GLUCOPHAGE) 1000 MG tablet Take 1 tablet by mouth 2 (two) times daily with a meal.   Microlet Lancets MISC See admin instructions.   OZEMPIC, 0.25 OR 0.5 MG/DOSE, 2 MG/3ML SOPN Inject 0.5 MG Subcutaneous Once a week for 30 days   predniSONE  (DELTASONE ) 50 MG tablet Take 1 tablet by mouth 13 hours, 7 hours  and 1 hour prior to the scan   simvastatin (ZOCOR) 10 MG tablet Take 1 tablet by mouth daily.   No current facility-administered medications for this visit. (Other)   REVIEW OF SYSTEMS:  ALLERGIES Allergies  Allergen Reactions   Aspirin Other (See Comments)    Other reaction(s): stomach burns Pt can not take large quantities of aspirin    Iodinated Contrast Media     Other Reaction(s): hives/itching/ ok if takes Benadryl    Omnipaque  [Iohexol] Swelling    Pt states at [facility] Radiology in [place] on 05/09/2016 he swelled all over after receiving 75mL of Omnipaque 350 concentration.    Penicillamine     Other reaction(s): hallucination Other reaction(s): hallucination   Penicillins Other (See Comments)    hallucinations Other reaction(s): Other hallucinations   PAST MEDICAL HISTORY Past Medical History:  Diagnosis Date   Chronic kidney disease    Diabetes mellitus without complication (HCC)    Hepatitis C infection 1969   Herpes simplex    History of CVA (cerebrovascular accident) 07/2017   Hyperlipidemia    Neck pain    Tension headache    Past Surgical History:  Procedure Laterality Date   None     FAMILY HISTORY Family History  Problem Relation Age of Onset   Breast cancer Mother    Stomach cancer Father    Heart disease Father    SOCIAL HISTORY Social History   Tobacco Use   Smoking status: Former    Types: Cigarettes    Passive exposure: Past   Smokeless tobacco: Former  Building services engineer status: Never Used  Substance Use Topics   Alcohol use: Not Currently   Drug use: Not Currently       OPHTHALMIC EXAM:  Not recorded    IMAGING AND PROCEDURES  Imaging and Procedures for 06/21/2023        ASSESSMENT/PLAN: No diagnosis found. 1.  2.  3.  Ophthalmic Meds Ordered this visit:  No orders of the defined types were placed in this encounter.    No follow-ups on file.  There are no Patient Instructions on file for this visit.  Explained the diagnoses, plan, and follow up with the patient and they expressed understanding.  Patient expressed understanding of the importance of proper follow up care.   This document serves as a record of services personally performed by Jeanice Millard, MD, PhD. It was created on their behalf by Angelia Kelp, an ophthalmic technician. The creation of this record is the provider's dictation and/or activities during the visit.     Electronically signed by: Angelia Kelp, OA, 06/12/23  3:39 PM   Jeanice Millard, M.D., Ph.D. Diseases & Surgery of the Retina and Vitreous [facility] 06/21/2023  Abbreviations: M myopia (nearsighted); A astigmatism; H hyperopia (farsighted); P presbyopia; Mrx spectacle prescription;  CTL contact lenses; OD right eye; OS left eye; OU both eyes  XT exotropia; ET esotropia; PEK punctate epithelial keratitis; PEE punctate epithelial erosions; DES dry eye syndrome; MGD meibomian gland dysfunction; ATs artificial tears; PFAT's preservative free artificial tears; NSC nuclear sclerotic cataract; PSC posterior subcapsular cataract; ERM epi-retinal membrane; PVD posterior vitreous detachment; RD retinal detachment; DM diabetes mellitus; DR diabetic retinopathy; NPDR non-proliferative diabetic retinopathy; PDR proliferative diabetic retinopathy; CSME clinically significant macular edema; DME diabetic macular edema; dbh dot blot hemorrhages; CWS cotton wool spot; POAG primary open angle glaucoma; C/D cup-to-disc ratio; HVF humphrey visual field; GVF goldmann visual field; OCT optical coherence tomography; IOP intraocular pressure; BRVO Branch retinal vein occlusion; CRVO central retinal vein occlusion; CRAO central retinal artery occlusion; BRAO branch retinal artery occlusion; RT retinal tear; SB scleral buckle; PPV pars plana vitrectomy; VH Vitreous hemorrhage; PRP panretinal laser photocoagulation; IVK intravitreal kenalog; VMT vitreomacular traction; MH Macular hole;  NVD neovascularization of the disc; NVE neovascularization elsewhere; AREDS age related eye disease study; ARMD age related macular degeneration; POAG primary open angle glaucoma; EBMD epithelial/anterior basement membrane dystrophy; ACIOL anterior chamber intraocular lens; IOL intraocular lens; PCIOL posterior chamber intraocular lens; Phaco/IOL phacoemulsification with intraocular lens placement; PRK photorefractive  keratectomy; LASIK laser assisted in situ keratomileusis; HTN hypertension; DM diabetes mellitus; COPD chronic obstructive pulmonary disease

## 2023-06-13 ENCOUNTER — Telehealth: Payer: Self-pay

## 2023-06-13 NOTE — Telephone Encounter (Signed)
 LVM to cancel DSM appt

## 2023-06-14 ENCOUNTER — Ambulatory Visit (INDEPENDENT_AMBULATORY_CARE_PROVIDER_SITE_OTHER): Payer: Medicare HMO

## 2023-06-14 ENCOUNTER — Ambulatory Visit (HOSPITAL_COMMUNITY)
Admission: RE | Admit: 2023-06-14 | Discharge: 2023-06-14 | Disposition: A | Discharge status: Home / Self Care | Payer: Medicare HMO | Source: Ambulatory Visit | Attending: Vascular Surgery | Admitting: Vascular Surgery

## 2023-06-14 VITALS — BP 148/94 | HR 89 | Temp 97.8°F | Ht 69.0 in | Wt 164.2 lb

## 2023-06-14 DIAGNOSIS — I70211 Atherosclerosis of native arteries of extremities with intermittent claudication, right leg: Secondary | ICD-10-CM | POA: Insufficient documentation

## 2023-06-14 NOTE — Progress Notes (Signed)
 Office Note     CC:  follow up Requesting Provider:  Arva Lathe,*  HPI: Richard Shaw is a 73 y.o. (1951/01/19) male who presents for follow up of PAD. He has had non lifestyle limiting claudication in the past that we have been following. He was having some issues with sciatic nerve pain at his last visit. He is still dealing with some sciatic discomfort but it is managed by Dr. Ibazebo at [facility] He has been getting some injections and doing PT, which has been helpful. Also will take Tramadol as needed. He otherwise says that he has no discomfort in his legs on ambulation or rest. No tissue loss. He goes to the [facility] regularly and also walks with his wife. He says his prior leg discomfort he has not experienced recently. He does report some swelling in his ankles and feet but if he drinks more water and elevates his legs he says it goes away. He has adjustable bed that he uses to elevate his legs at night. He also has been trying to elevate during the day when watching TV. He is compliant with his statin medication.   Past Medical History:  Diagnosis Date   Chronic kidney disease    Diabetes mellitus without complication (HCC)    Hepatitis C infection 1969   Herpes simplex    History of CVA (cerebrovascular accident) 07/2017   Hyperlipidemia    Neck pain    Tension headache     Past Surgical History:  Procedure Laterality Date   None      Social History   Socioeconomic History   Marital status: Married    Spouse name: Not on file   Number of children: 2   Years of education: 12   Highest education level: High school graduate  Occupational History   Occupation: Retired  Tobacco Use   Smoking status: Former    Types: Cigarettes    Passive exposure: Past   Smokeless tobacco: Former  Building services engineer status: Never Used  Substance and Sexual Activity   Alcohol use: Not Currently   Drug use: Not Currently   Sexual activity: Yes  Other Topics Concern    Not on file  Social History Narrative   Lives at home with his wife.   Right-handed.   One cup caffeine per day.   Social Drivers of Corporate investment banker Strain: Not on file  Food Insecurity: Not on file  Transportation Needs: Not on file  Physical Activity: Not on file  Stress: Not on file  Social Connections: Not on file  Intimate Partner Violence: Not on file    Family History  Problem Relation Age of Onset   Breast cancer Mother    Stomach cancer Father    Heart disease Father     Current Outpatient Medications  Medication Sig Dispense Refill   diphenhydrAMINE  (BENADRYL ) 50 MG tablet Take 1 tablet 1 hour prior to the scan 1 tablet 0   EPINEPHrine 0.3 mg/0.3 mL IJ SOAJ injection Inject 0.3 mg into the muscle as needed.      famciclovir (FAMVIR) 500 MG tablet Take by mouth.     glucose blood (CONTOUR NEXT TEST) test strip See admin instructions.     hydrOXYzine (ATARAX/VISTARIL) 25 MG tablet 1 tablet as needed     JARDIANCE 25 MG TABS tablet Take 25 mg by mouth daily.     loratadine (CLARITIN) 10 MG tablet Take 10 mg by mouth daily.  metFORMIN (GLUCOPHAGE) 1000 MG tablet Take 1 tablet by mouth 2 (two) times daily with a meal.     Microlet Lancets MISC See admin instructions.     OZEMPIC, 0.25 OR 0.5 MG/DOSE, 2 MG/3ML SOPN Inject 0.5 MG Subcutaneous Once a week for 30 days     predniSONE  (DELTASONE ) 50 MG tablet Take 1 tablet by mouth 13 hours, 7 hours  and 1 hour prior to the scan 3 tablet 0   simvastatin (ZOCOR) 10 MG tablet Take 1 tablet by mouth daily.     traMADol (ULTRAM) 50 MG tablet Take 50 mg by mouth 3 (three) times daily.     No current facility-administered medications for this visit.    Allergies  Allergen Reactions   Aspirin Other (See Comments)    Other reaction(s): stomach burns Pt can not take large quantities of aspirin    Iodinated Contrast Media     Other Reaction(s): hives/itching/ ok if takes Benadryl    Omnipaque [Iohexol] Swelling     Pt states at [facility] Radiology in [place] on 05/09/2016 he swelled all over after receiving 75mL of Omnipaque 350 concentration.    Penicillamine     Other reaction(s): hallucination Other reaction(s): hallucination   Penicillins Other (See Comments)    hallucinations Other reaction(s): Other hallucinations     REVIEW OF SYSTEMS:  [X]  denotes positive finding, [ ]  denotes negative finding Cardiac  Comments:  Chest pain or chest pressure:    Shortness of breath upon exertion:    Short of breath when lying flat:    Irregular heart rhythm:        Vascular    Pain in calf, thigh, or hip brought on by ambulation:    Pain in feet at night that wakes you up from your sleep:     Blood clot in your veins:    Leg swelling:         Pulmonary    Oxygen at home:    Productive cough:     Wheezing:         Neurologic    Sudden weakness in arms or legs:     Sudden numbness in arms or legs:     Sudden onset of difficulty speaking or slurred speech:    Temporary loss of vision in one eye:     Problems with dizziness:         Gastrointestinal    Blood in stool:     Vomited blood:         Genitourinary    Burning when urinating:     Blood in urine:        Psychiatric    Major depression:         Hematologic    Bleeding problems:    Problems with blood clotting too easily:        Skin    Rashes or ulcers:        Constitutional    Fever or chills:      PHYSICAL EXAMINATION:  Vitals:   06/14/23 1507  BP: (!) 148/94  Pulse: 89  Temp: 97.8 F (36.6 C)  TempSrc: Temporal  SpO2: 93%  Weight: 164 lb 3.2 oz (74.5 kg)  Height: 5\' 9"  (1.753 m)    General:  WDWN in NAD; vital signs documented above Gait: Normal HENT: WNL, normocephalic Pulmonary: normal non-labored breathing Cardiac: regular HR Abdomen: soft Vascular Exam/Pulses: 2+ femoral pulses, no palpable distal pulses, feet warm and well perfused Extremities: without ischemic changes,  without Gangrene ,  without cellulitis; without open wounds;  Musculoskeletal: no muscle wasting or atrophy  Neurologic: A&O X 3 Psychiatric:  The pt has Normal affect.   Non-Invasive Vascular Imaging:   +-------+-----------+-----------+------------+------------+  ABI/TBIToday's ABIToday's TBIPrevious ABIPrevious TBI  +-------+-----------+-----------+------------+------------+  Right .66        .37        .77         .59           +-------+-----------+-----------+------------+------------+  Left  .50        .15        .59         .39           +-------+-----------+-----------+------------+------------+  Left great toe pressure: 23 mmHg Right great toe pressure: 56 mmHg   Bilateral ABIs and TBIs appear essentially unchanged compared to prior  study on 05/18/2022.    ASSESSMENT/PLAN:: 73 y.o. male here for follow up for PAD. Previously had some non lifestyle limiting claudication. He has not had recurrent symptoms recently. He is without claudication, rest pain or tissue loss. He mostly is having symptoms from sciatic nerve pain - ABI today shows slight decrease but when compared to prior studies overall stable - encourage him to continue his exercise regimen - continue statin  - Follow up in 1 year with repeat ABI - He knows to follow up earlier if any new or worsening symptoms    Deneen Finical, PA-C [facility] [phone]  Clinic MD:   Vikki Graves

## 2023-06-15 ENCOUNTER — Other Ambulatory Visit

## 2023-06-15 ENCOUNTER — Ambulatory Visit (INDEPENDENT_AMBULATORY_CARE_PROVIDER_SITE_OTHER): Admitting: Podiatry

## 2023-06-15 DIAGNOSIS — M2041 Other hammer toe(s) (acquired), right foot: Secondary | ICD-10-CM | POA: Diagnosis not present

## 2023-06-15 DIAGNOSIS — M2042 Other hammer toe(s) (acquired), left foot: Secondary | ICD-10-CM | POA: Diagnosis not present

## 2023-06-15 DIAGNOSIS — E1142 Type 2 diabetes mellitus with diabetic polyneuropathy: Secondary | ICD-10-CM | POA: Diagnosis not present

## 2023-06-15 LAB — VAS US ABI WITH/WO TBI
Left ABI: 0.5
Right ABI: 0.66

## 2023-06-15 NOTE — Progress Notes (Signed)
 Subjective:  Patient ID: Richard Shaw, male    DOB: [birth year],  MRN: [id number]  Chief Complaint  Patient presents with   Diabetes    Wants to get shoes     73 y.o. male presents with the above complaint.  Patient presents with bilateral hammertoe contracture and with underlying diabetes.  Patient states he is irrigated measure for diabetic shoes.  He does not have any ulceration concerns at this time.  Denies any other acute complaints.   Review of Systems: Negative except as noted in the HPI. Denies N/V/F/Ch.  Past Medical History:  Diagnosis Date   Chronic kidney disease    Diabetes mellitus without complication (HCC)    Hepatitis C infection 1969   Herpes simplex    History of CVA (cerebrovascular accident) 07/2017   Hyperlipidemia    Neck pain    Tension headache     Current Outpatient Medications:    diphenhydrAMINE  (BENADRYL ) 50 MG tablet, Take 1 tablet 1 hour prior to the scan, Disp: 1 tablet, Rfl: 0   EPINEPHrine 0.3 mg/0.3 mL IJ SOAJ injection, Inject 0.3 mg into the muscle as needed. , Disp: , Rfl:    famciclovir (FAMVIR) 500 MG tablet, Take by mouth., Disp: , Rfl:    glucose blood (CONTOUR NEXT TEST) test strip, See admin instructions., Disp: , Rfl:    hydrOXYzine (ATARAX/VISTARIL) 25 MG tablet, 1 tablet as needed, Disp: , Rfl:    JARDIANCE 25 MG TABS tablet, Take 25 mg by mouth daily., Disp: , Rfl:    loratadine (CLARITIN) 10 MG tablet, Take 10 mg by mouth daily., Disp: , Rfl:    metFORMIN (GLUCOPHAGE) 1000 MG tablet, Take 1 tablet by mouth 2 (two) times daily with a meal., Disp: , Rfl:    Microlet Lancets MISC, See admin instructions., Disp: , Rfl:    OZEMPIC, 0.25 OR 0.5 MG/DOSE, 2 MG/3ML SOPN, Inject 0.5 MG Subcutaneous Once a week for 30 days, Disp: , Rfl:    predniSONE  (DELTASONE ) 50 MG tablet, Take 1 tablet by mouth 13 hours, 7 hours  and 1 hour prior to the scan, Disp: 3 tablet, Rfl: 0   simvastatin (ZOCOR) 10 MG tablet, Take 1 tablet by mouth daily., Disp:  , Rfl:    traMADol (ULTRAM) 50 MG tablet, Take 50 mg by mouth 3 (three) times daily., Disp: , Rfl:   Social History   Tobacco Use  Smoking Status Former   Types: Cigarettes   Passive exposure: Past  Smokeless Tobacco Former    Allergies  Allergen Reactions   Aspirin Other (See Comments)    Other reaction(s): stomach burns Pt can not take large quantities of aspirin    Iodinated Contrast Media     Other Reaction(s): hives/itching/ ok if takes Benadryl    Omnipaque [Iohexol] Swelling    Pt states at [facility]iology in [place] on 05/09/2016 he swelled all over after receiving 75mL of Omnipaque 350 concentration.    Penicillamine     Other reaction(s): hallucination Other reaction(s): hallucination   Penicillins Other (See Comments)    hallucinations Other reaction(s): Other hallucinations   Objective:  There were no vitals filed for this visit. There is no height or weight on file to calculate BMI. Constitutional Well developed. Well nourished.  Vascular Dorsalis pedis pulses palpable bilaterally. Posterior tibial pulses palpable bilaterally. Capillary refill normal to all digits.  No cyanosis or clubbing noted. Pedal hair growth normal.  Neurologic Normal speech. Oriented to person, place, and time. Epicritic sensation to light  touch grossly present bilaterally.  Dermatologic Nails well groomed and normal in appearance. No open wounds. No skin lesions.  Orthopedic: Bilateral hammertoe contracture noted semiflexible in nature open no open wounds or lesion noted.  No abnormalities identified   Radiographs: None Assessment:   1. Diabetic peripheral neuropathy associated with type 2 diabetes mellitus (HCC)   2. Hammertoe, bilateral    Plan:  Patient was evaluated and treated and all questions answered.  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  He is a high risk of undergoing digital amputation.  He does not have any open wounds or lesion at this time  No follow-ups on file.

## 2023-06-16 ENCOUNTER — Other Ambulatory Visit

## 2023-06-21 ENCOUNTER — Encounter (INDEPENDENT_AMBULATORY_CARE_PROVIDER_SITE_OTHER): Admitting: Ophthalmology

## 2023-06-21 DIAGNOSIS — H3581 Retinal edema: Secondary | ICD-10-CM

## 2023-07-14 ENCOUNTER — Other Ambulatory Visit

## 2023-07-31 ENCOUNTER — Encounter: Payer: Self-pay | Admitting: Podiatry

## 2023-07-31 ENCOUNTER — Encounter (INDEPENDENT_AMBULATORY_CARE_PROVIDER_SITE_OTHER): Admitting: Podiatry

## 2023-07-31 ENCOUNTER — Ambulatory Visit (INDEPENDENT_AMBULATORY_CARE_PROVIDER_SITE_OTHER): Payer: Medicare HMO | Admitting: Podiatry

## 2023-07-31 DIAGNOSIS — E1151 Type 2 diabetes mellitus with diabetic peripheral angiopathy without gangrene: Secondary | ICD-10-CM

## 2023-07-31 DIAGNOSIS — Q828 Other specified congenital malformations of skin: Secondary | ICD-10-CM

## 2023-07-31 DIAGNOSIS — E119 Type 2 diabetes mellitus without complications: Secondary | ICD-10-CM | POA: Diagnosis not present

## 2023-07-31 DIAGNOSIS — M2041 Other hammer toe(s) (acquired), right foot: Secondary | ICD-10-CM

## 2023-07-31 DIAGNOSIS — M2011 Hallux valgus (acquired), right foot: Secondary | ICD-10-CM

## 2023-07-31 DIAGNOSIS — M79675 Pain in left toe(s): Secondary | ICD-10-CM | POA: Diagnosis not present

## 2023-07-31 DIAGNOSIS — B351 Tinea unguium: Secondary | ICD-10-CM | POA: Diagnosis not present

## 2023-07-31 DIAGNOSIS — M2042 Other hammer toe(s) (acquired), left foot: Secondary | ICD-10-CM

## 2023-07-31 DIAGNOSIS — M2012 Hallux valgus (acquired), left foot: Secondary | ICD-10-CM

## 2023-07-31 DIAGNOSIS — M79674 Pain in right toe(s): Secondary | ICD-10-CM

## 2023-07-31 NOTE — Progress Notes (Signed)
1. ERRONEOUS ENCOUNTER--DISREGARD

## 2023-07-31 NOTE — Progress Notes (Signed)
 ANNUAL DIABETIC FOOT EXAM  Subjective: Richard Shaw presents today for annual diabetic foot exam.  Chief Complaint  Patient presents with   [facility]    Rm2/ Diabetic Dr. Charlott last visit June 2025/ A1c6.8   Patient confirms h/o diabetes.  Patient denies any h/o foot wounds.  He is followed by Vascular Surgery for PAD.  Patient has been diagnosed with neuropathy.  Varadarajan, Rupashree, MD is patient's PCP.  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 04/30/2021   Atopic dermatitis 03/30/2020   Chronic renal failure syndrome 03/30/2020   Migraine aura without headache 03/30/2020   Neuropathy 03/30/2020   Personal history of transient ischemic attack (TIA), and cerebral infarction without residual deficits 03/30/2020   Polycystic kidney disease 03/30/2020   Slow transit constipation 03/30/2020   Tension-type headache 03/30/2020   Diabetic peripheral neuropathy (HCC) 05/27/2019   Neck pain 05/27/2019   DM (diabetes mellitus) (HCC) 01/30/2018   Herpes 01/30/2018   Herpesvirus infection 03/15/2017   Past Surgical History:  Procedure Laterality Date   None     Current Outpatient Medications on File Prior to Visit  Medication Sig Dispense Refill   diphenhydrAMINE  (BENADRYL ) 50 MG tablet Take 1 tablet 1 hour prior to the scan 1 tablet 0   EPINEPHrine 0.3 mg/0.3 mL IJ SOAJ injection Inject 0.3 mg into the muscle as needed.      famciclovir (FAMVIR) 500 MG tablet Take by mouth.     glucose blood (CONTOUR NEXT TEST) test strip See admin instructions.     hydrOXYzine (ATARAX/VISTARIL) 25 MG tablet 1 tablet as needed      JARDIANCE 25 MG TABS tablet Take 25 mg by mouth daily.     loratadine (CLARITIN) 10 MG tablet Take 10 mg by mouth daily.     metFORMIN (GLUCOPHAGE) 1000 MG tablet Take 1 tablet by mouth 2 (two) times daily with a meal.     Microlet Lancets MISC See admin instructions.     OZEMPIC, 0.25 OR 0.5 MG/DOSE, 2 MG/3ML SOPN Inject 0.5 MG Subcutaneous Once a week for 30 days     predniSONE  (DELTASONE ) 50 MG tablet Take 1 tablet by mouth 13 hours, 7 hours  and 1 hour prior to the scan 3 tablet 0   simvastatin (ZOCOR) 10 MG tablet Take 1 tablet by mouth daily.     traMADol (ULTRAM) 50 MG tablet Take 50 mg by mouth 3 (three) times daily.     No current facility-administered medications on file prior to visit.    Allergies  Allergen Reactions   Aspirin Other (See Comments)    Other reaction(s): stomach burns Pt can not take large quantities of aspirin    Iodinated Contrast Media     Other Reaction(s): hives/itching/ ok if takes Benadryl    Omnipaque [Iohexol] Swelling    Pt states at [facility] in [place] on 05/09/2016 he swelled all over after receiving 75mL of Omnipaque 350 concentration.    Penicillamine     Other reaction(s): hallucination Other reaction(s): hallucination   Penicillins Other (See Comments)    hallucinations Other reaction(s): Other  hallucinations   Social History   Occupational History   Occupation: Retired  Tobacco Use   Smoking status: Former    Types: Cigarettes    Passive exposure: Past   Smokeless tobacco: Former  Building services engineer status: Never Used  Substance and Sexual Activity   Alcohol use: Not Currently   Drug use: Not Currently   Sexual activity: Yes   Family History  Problem Relation Age of Onset   Breast cancer Mother    Stomach cancer Father    Heart disease Father    Immunization History  Administered Date(s) Administered   Fluad Quad(high Dose 65+) 10/05/2021   Influenza, High Dose Seasonal PF 09/19/2018     Review of Systems:  Negative except as noted in the HPI.   Objective: There were no vitals filed for this visit.  Richard Shaw is a pleasant 73 y.o. male in NAD. AAO X 3.  Diabetic foot exam was performed with the following findings:   Vascular Examination: CFT <3 seconds b/l LE. Palpable DP pulse(s) right lower extremity Palpable PT pulse(s) right lower extremity Diminished DP pulse(s) left lower extremity. Diminished PT pulse(s) left lower extremity. Pedal hair absent. Lower extremity skin temperature gradient within normal limits. No edema noted b/l LE. No ischemia or gangrene noted b/l LE. No cyanosis or clubbing noted b/l LE.  Neurological Examination: Pt has subjective symptoms of neuropathy. Protective sensation intact 5/5 intact bilaterally with 10g monofilament b/l.  Dermatological Examination: Pedal skin warm and supple b/l.  No open wounds b/l. No interdigital macerations. Toenails 1-5 b/l elongated, thickened, discolored with subungual debris. +Tenderness with dorsal palpation of nailplates. Porokeratotic lesion(s) noted submet head 1 right foot, submet head 3 left foot, and submet head 5 right foot.  Musculoskeletal Examination: Muscle strength 5/5 to all lower extremity muscle groups bilaterally. HAV with bunion deformity noted b/l LE. Hammertoe(s) 2-5 b/l.SABRA No pain, crepitus or joint limitation noted with ROM b/l LE.  Patient ambulates independently without assistive aids.        VAS US  ABI WITH/WO TBI Result Date: 06/15/2023  LOWER EXTREMITY DOPPLER STUDY [name]  Date of Exam:   06/14/2023 Medical Rec #: [id number]     Accession #:    [id number] Date of Birth: [birth year]     Patient Gender: M Patient Age:   72 years Exam Location:  [address] Procedure:      VAS US  ABI WITH/WO TBI Referring Phys: Sheree Riis MD --------------------------------------------------------------------------------  Indications: Peripheral artery disease. Patient reports swelling and numbness in               both feet. He denies any clinical claudication symptoms or rest              pain, but does reports occasional right leg pain coming from the              hip radiating to the buttocks and thigh when having issues with the              sciatic nerve.  Comparison Study: In 05/2022, a lower arterial Doppler showed an ABI of .77 on                   the right and .59 on the left. Performing Technologist: Nanetta Shad RVT  Examination Guidelines: A complete evaluation includes at minimum, Doppler waveform signals and systolic blood pressure reading at the level of bilateral brachial, anterior tibial, and posterior tibial  arteries, when vessel segments are accessible. Bilateral testing is considered an integral part of a complete examination. Photoelectric Plethysmograph (PPG) waveforms and toe systolic pressure readings are included as required and additional duplex testing as needed. Limited examinations for reoccurring indications may be performed as noted.  ABI Findings: +---------+------------------+-----+----------+--------+ Right    Rt Pressure (mmHg)IndexWaveform  Comment  +---------+------------------+-----+----------+--------+ Brachial 142                                       +---------+------------------+-----+----------+--------+ PTA      98                0.65 monophasic         +---------+------------------+-----+----------+--------+ DP       91                0.61 monophasic         +---------+------------------+-----+----------+--------+ Great Toe56                0.37 Abnormal           +---------+------------------+-----+----------+--------+ +---------+------------------+-----+----------+-------+ Left     Lt Pressure (mmHg)IndexWaveform  Comment +---------+------------------+-----+----------+-------+ Brachial 150                                      +---------+------------------+-----+----------+-------+ PTA      75                0.50  monophasic        +---------+------------------+-----+----------+-------+ DP       72                0.48 monophasic        +---------+------------------+-----+----------+-------+ Great Toe23                0.15 Abnormal          +---------+------------------+-----+----------+-------+ +-------+-----------+-----------+------------+------------+ ABI/TBIToday's ABIToday's TBIPrevious ABIPrevious TBI +-------+-----------+-----------+------------+------------+ Right  .66        .37        .77         .59          +-------+-----------+-----------+------------+------------+ Left   .50        .15        .59         .39          +-------+-----------+-----------+------------+------------+  Bilateral ABIs and TBIs appear essentially unchanged compared to prior study on 05/18/2022.  Summary: Right: Resting right ankle-brachial index indicates moderate right lower extremity arterial disease. The right toe-brachial index is abnormal. Left: Resting left ankle-brachial index indicates moderate left lower extremity arterial disease. The left toe-brachial index is abnormal. *See table(s) above for measurements and observations.  Electronically signed by Fonda Rim on 06/15/2023 at 10:11:58 AM.    Final    ADA Risk Categorization: High Risk  Patient has one or more of the following: Loss of protective sensation Absent pedal pulses Severe Foot deformity History of foot ulcer  Assessment: 1. Pain due to onychomycosis of toenails of both feet   2. Porokeratosis   3. Hallux valgus, acquired, bilateral   4. Hammertoe, bilateral   5. Type II diabetes mellitus with peripheral circulatory disorder (HCC)   6. Encounter for diabetic foot exam (HCC)     Plan: Diabetic foot examination performed today.  All patient's and/or POA's questions/concerns  addressed on today's visit. Toenails 1-5 debrided in length and girth without incident. Porokeratotic lesion(s) submet head 1 right foot, submet  head 3 left foot, and submet head 5 right foot pared with sharp debridement without incident. Continue daily foot inspections and monitor blood glucose per PCP/Endocrinologist's recommendations. Continue soft, supportive shoe gear daily. Report any pedal injuries to medical professional. Call office if there are any questions/concerns. -Patient/POA to call should there be question/concern in the interim. Return in about 9 weeks (around 10/02/2023).  Delon LITTIE Merlin, DPM      [facility] LOCATION: [address], [place] [address]                   Office [phone]   [facility] LOCATION: [address] [place], [place] [address] Office [phone]

## 2023-08-09 ENCOUNTER — Encounter: Payer: Self-pay | Admitting: Urology

## 2023-08-14 ENCOUNTER — Ambulatory Visit
Admission: RE | Admit: 2023-08-14 | Discharge: 2023-08-14 | Disposition: A | Discharge status: Home / Self Care | Source: Ambulatory Visit | Attending: Urology | Admitting: Urology

## 2023-08-14 DIAGNOSIS — N2889 Other specified disorders of kidney and ureter: Secondary | ICD-10-CM | POA: Insufficient documentation

## 2023-08-14 MED ORDER — GADOBUTROL 1 MMOL/ML IV SOLN
7.0000 mL | Freq: Once | INTRAVENOUS | Status: AC | PRN
  Administered 2023-08-14: 7 mL via INTRAVENOUS

## 2023-08-16 ENCOUNTER — Ambulatory Visit: Payer: Self-pay | Admitting: Urology

## 2023-09-26 ENCOUNTER — Telehealth: Payer: Self-pay | Admitting: Podiatry

## 2023-09-26 NOTE — Telephone Encounter (Signed)
 Patient called asking for a RX to be sent to [facility] for diabetic shoes

## 2023-09-28 ENCOUNTER — Other Ambulatory Visit (INDEPENDENT_AMBULATORY_CARE_PROVIDER_SITE_OTHER): Admitting: Podiatry

## 2023-09-28 DIAGNOSIS — M2011 Hallux valgus (acquired), right foot: Secondary | ICD-10-CM

## 2023-09-28 DIAGNOSIS — M2012 Hallux valgus (acquired), left foot: Secondary | ICD-10-CM

## 2023-09-28 DIAGNOSIS — M2042 Other hammer toe(s) (acquired), left foot: Secondary | ICD-10-CM

## 2023-09-28 DIAGNOSIS — I739 Peripheral vascular disease, unspecified: Secondary | ICD-10-CM

## 2023-09-28 DIAGNOSIS — M2041 Other hammer toe(s) (acquired), right foot: Secondary | ICD-10-CM

## 2023-09-28 DIAGNOSIS — Q828 Other specified congenital malformations of skin: Secondary | ICD-10-CM

## 2023-09-28 DIAGNOSIS — E1142 Type 2 diabetes mellitus with diabetic polyneuropathy: Secondary | ICD-10-CM

## 2023-09-28 NOTE — Progress Notes (Signed)
 1. Porokeratosis   2. Hallux valgus, acquired, bilateral   3. Hammertoe, bilateral   4. PAD (peripheral artery disease) (HCC)   5. Diabetic peripheral neuropathy associated with type 2 diabetes mellitus (HCC)    Orders Placed This Encounter  Procedures   For home use only DME Other see comment    Diespense one pair extra depth shoes and 3 pair custom insoles. Offload calluses submet head 1 right foot, submet head 3 left foot and submet head 5 right foot.    Length of Need:   6 Months

## 2023-10-03 ENCOUNTER — Telehealth: Payer: Self-pay | Admitting: Podiatry

## 2023-10-03 NOTE — Telephone Encounter (Signed)
 Patient requests contact information for diabetic shoe referral. He needs to call and make an appointment with them.

## 2023-10-13 ENCOUNTER — Encounter: Payer: Self-pay | Admitting: Podiatry

## 2023-10-13 ENCOUNTER — Ambulatory Visit (INDEPENDENT_AMBULATORY_CARE_PROVIDER_SITE_OTHER): Admitting: Podiatry

## 2023-10-13 ENCOUNTER — Telehealth: Payer: Self-pay

## 2023-10-13 DIAGNOSIS — Q828 Other specified congenital malformations of skin: Secondary | ICD-10-CM

## 2023-10-13 DIAGNOSIS — I739 Peripheral vascular disease, unspecified: Secondary | ICD-10-CM

## 2023-10-13 DIAGNOSIS — M79674 Pain in right toe(s): Secondary | ICD-10-CM

## 2023-10-13 DIAGNOSIS — B351 Tinea unguium: Secondary | ICD-10-CM

## 2023-10-13 DIAGNOSIS — M79675 Pain in left toe(s): Secondary | ICD-10-CM

## 2023-10-13 DIAGNOSIS — E1142 Type 2 diabetes mellitus with diabetic polyneuropathy: Secondary | ICD-10-CM

## 2023-10-13 NOTE — Telephone Encounter (Signed)
 Patient called  c/o BIL foot swelling for the past three months.  1 year follow up moved up with ABI appt 10/17/23 3:30pm and PA appt 11/15/23 at 1:15 pm.  The patient was advised to call us  back if there is a change in the color or temperature of his feet or if he develops leg or foot pain.  The patient verbalized understanding of the above.

## 2023-10-16 ENCOUNTER — Other Ambulatory Visit: Payer: Self-pay

## 2023-10-16 DIAGNOSIS — I70211 Atherosclerosis of native arteries of extremities with intermittent claudication, right leg: Secondary | ICD-10-CM

## 2023-10-16 NOTE — Progress Notes (Signed)
 Subjective:  Patient ID: Richard Shaw, male    DOB: [birth year],  MRN: [id number]  Richard Shaw presents to clinic today for at risk footcare. Patient has h/o diabetes, neuropathy and PAD and is seen for  and painful porokeratotic lesion(s) of both feet and painful mycotic toenails that limit ambulation. Painful toenails interfere with ambulation. Aggravating factors include wearing enclosed shoe gear. Pain is relieved with periodic professional debridement. Painful porokeratotic lesions are aggravated when weightbearing with and without shoegear. Pain is relieved with periodic professional debridement.  Chief Complaint  Patient presents with   [facility]    Rm1 Diabetic foot care   New problem(s): None.   PCP is Charlott Dorn LABOR, MD. ARNETTA 09/21/2023.  Allergies  Allergen Reactions   Aspirin Other (See Comments)    Other reaction(s): stomach burns Pt can not take large quantities of aspirin    Iodinated Contrast Media     Other Reaction(s): hives/itching/ ok if takes Benadryl    Omnipaque [Iohexol] Swelling    Pt states at [facility] in [place] on 05/09/2016 he swelled all over after receiving 75mL of Omnipaque 350 concentration.    Penicillamine     Other reaction(s): hallucination Other reaction(s): hallucination   Penicillins Other (See Comments)    hallucinations Other reaction(s): Other hallucinations    Review of Systems: Negative except as noted in the HPI.  Objective: No changes noted in today's physical examination. There were no vitals filed for this visit. Richard Shaw is a pleasant 73 y.o. male WD, WN in NAD. AAO x 3.  Vascular Examination: CFT <3 seconds b/l LE. Palpable DP pulse(s) right lower extremity Palpable PT pulse(s) right lower extremity Diminished DP pulse(s) left lower extremity. Diminished PT pulse(s) left lower extremity. Pedal hair absent. Lower extremity skin temperature gradient within normal limits. No edema noted b/l LE. No ischemia or gangrene  noted b/l LE. No cyanosis or clubbing noted b/l LE.  Neurological Examination: Pt has subjective symptoms of neuropathy. Protective sensation intact 5/5 intact bilaterally with 10g monofilament b/l.  Dermatological Examination: Pedal skin warm and supple b/l.  No open wounds b/l. No interdigital macerations. Toenails 1-5 b/l elongated, thickened, discolored with subungual debris. +Tenderness with dorsal palpation of nailplates. Porokeratotic lesion(s) noted submet head 1 right foot, submet head 3 left foot, and submet head 5 right foot.  Musculoskeletal Examination: Muscle strength 5/5 to all lower extremity muscle groups bilaterally. HAV with bunion deformity noted b/l LE. Hammertoe(s) 2-5 b/l.SABRA No pain, crepitus or joint limitation noted with ROM b/l LE.  Patient ambulates independently without assistive aids.  Assessment/Plan: 1. Pain due to onychomycosis of toenails of both feet   2. Porokeratosis   3. PAD (peripheral artery disease) (HCC)   4. Diabetic peripheral neuropathy associated with type 2 diabetes mellitus (HCC)   Patient was evaluated and treated. All patient's and/or POA's questions/concerns addressed on today's visit. Mycotic toenails 1-5 debrided in length and girth without incident. Porokeratotic lesion(s) submet head 1 right foot, submet head 3 left foot, and submet head 5 right foot pared with sharp debridement without incident. Continue daily foot inspections and monitor blood glucose per PCP/Endocrinologist's recommendations. Continue soft, supportive shoe gear daily. Report any pedal injuries to medical professional. Call office if there are any questions/concerns.  Return in about 9 weeks (around 12/15/2023).  Delon LITTIE Merlin, DPM       LOCATION: [address]  [place], [place] [address]                   Office [phone]   [facility] LOCATION: [address] [place], [place] [address] Office [phone]

## 2023-10-17 ENCOUNTER — Ambulatory Visit (HOSPITAL_COMMUNITY)
Admission: RE | Admit: 2023-10-17 | Discharge: 2023-10-17 | Disposition: A | Discharge status: Home / Self Care | Source: Ambulatory Visit | Attending: Vascular Surgery | Admitting: Vascular Surgery

## 2023-10-17 DIAGNOSIS — I70211 Atherosclerosis of native arteries of extremities with intermittent claudication, right leg: Secondary | ICD-10-CM | POA: Insufficient documentation

## 2023-10-17 LAB — VAS US ABI WITH/WO TBI
Left ABI: 0.55
Right ABI: 0.67

## 2023-11-15 ENCOUNTER — Ambulatory Visit: Attending: Vascular Surgery | Admitting: Physician Assistant

## 2023-11-15 VITALS — BP 128/72 | HR 80 | Temp 97.8°F | Wt 168.1 lb

## 2023-11-15 DIAGNOSIS — I739 Peripheral vascular disease, unspecified: Secondary | ICD-10-CM

## 2023-11-15 NOTE — Progress Notes (Signed)
 Office Note   History of Present Illness   Richard Shaw is a 73 y.o. (04-09-1950) male who presents for surveillance of PAD.  He has previously been evaluated by our office for claudication.  He has no prior history of vascular intervention.  He does have a history of ankle and foot swelling bilaterally.  He returns today for follow-up.  He denies any claudication, rest pain, or tissue loss.  He says that he continues to stay very active and exercises regularly at the [facility].  He is most bothered by his persistent foot swelling, which he says is always present.  He says that it feels like he is walking on water.  This does improve with leg elevation.  Current Outpatient Medications  Medication Sig Dispense Refill   diphenhydrAMINE  (BENADRYL ) 50 MG tablet Take 1 tablet 1 hour prior to the scan 1 tablet 0   EPINEPHrine 0.3 mg/0.3 mL IJ SOAJ injection Inject 0.3 mg into the muscle as needed.      famciclovir (FAMVIR) 500 MG tablet Take by mouth.     glucose blood (CONTOUR NEXT TEST) test strip See admin instructions.     hydrOXYzine (ATARAX/VISTARIL) 25 MG tablet 1 tablet as needed     JARDIANCE 25 MG TABS tablet Take 25 mg by mouth daily.     loratadine (CLARITIN) 10 MG tablet Take 10 mg by mouth daily.     metFORMIN (GLUCOPHAGE) 1000 MG tablet Take 1 tablet by mouth 2 (two) times daily with a meal.     Microlet Lancets MISC See admin instructions.     OZEMPIC, 0.25 OR 0.5 MG/DOSE, 2 MG/3ML SOPN Inject 0.5 MG Subcutaneous Once a week for 30 days     predniSONE  (DELTASONE ) 50 MG tablet Take 1 tablet by mouth 13 hours, 7 hours  and 1 hour prior to the scan 3 tablet 0   simvastatin (ZOCOR) 10 MG tablet Take 1 tablet by mouth daily.     traMADol (ULTRAM) 50 MG tablet Take 50 mg by mouth 3 (three) times daily.     No current facility-administered medications for this visit.    REVIEW OF SYSTEMS (negative unless checked):   Cardiac:  []  Chest pain or chest pressure? []  Shortness of  breath upon activity? []  Shortness of breath when lying flat? []  Irregular heart rhythm?  Vascular:  []  Pain in calf, thigh, or hip brought on by walking? []  Pain in feet at night that wakes you up from your sleep? []  Blood clot in your veins? []  Leg swelling?  Pulmonary:  []  Oxygen at home? []  Productive cough? []  Wheezing?  Neurologic:  []  Sudden weakness in arms or legs? []  Sudden numbness in arms or legs? []  Sudden onset of difficult speaking or slurred speech? []  Temporary loss of vision in one eye? []  Problems with dizziness?  Gastrointestinal:  []  Blood in stool? []  Vomited blood?  Genitourinary:  []  Burning when urinating? []  Blood in urine?  Psychiatric:  []  Major depression  Hematologic:  []  Bleeding problems? []  Problems with blood clotting?  Dermatologic:  []  Rashes or ulcers?  Constitutional:  []  Fever or chills?  Ear/Nose/Throat:  []  Change in hearing? []  Nose bleeds? []  Sore throat?  Musculoskeletal:  []  Back pain? []  Joint pain? []  Muscle pain?   Physical Examination   Vitals:   11/15/23 1309  BP: 128/72  Pulse: 80  Temp: 97.8 F (36.6 C)  TempSrc: Temporal  Weight: 168 lb 1.6 oz (76.2 kg)  Body mass index is 24.82 kg/m.  General:  WDWN in NAD; vital signs documented above Gait: Not observed HENT: WNL, normocephalic Pulmonary: normal non-labored breathing , without rales, rhonchi,  wheezing Cardiac: regular Abdomen: soft, NT, no masses Skin: without rashes Vascular Exam/Pulses: Brisk monophasic DP/PT Doppler signals bilaterally Extremities: without ischemic changes, without gangrene , without cellulitis; without open wounds; very minimal edema of bilateral feet.  No lower leg or ankle swelling Musculoskeletal: no muscle wasting or atrophy  Neurologic: A&O X 3;  No focal weakness or paresthesias are detected Psychiatric:  The pt has Normal affect.  Non-Invasive Vascular imaging   ABI (10/17/2023) R:  ABI: 0.67 (0.66),   PT: mono DP: mono TBI:  0.41 L:  ABI: 0.55 (0.5),  PT: mono DP: mono TBI: 0.32   Medical Decision Making   Richard Shaw is a 73 y.o. male who presents for surveillance of PAD  Based on the patient's vascular studies, his ABIs are essentially unchanged bilaterally.  His right ABI is 0.67 and left ABI is 0.55 He denies any rest pain, claudication, or tissue loss.  He stays very active and walks regularly at the [facility] On exam he has brisk monophasic DP/PT Doppler signals bilaterally.  He has very mild swelling in his feet, mostly near the toes. I have discussed with the patient that his foot swelling is not dangerous or limb threatening.  I do not think that this is due to underlying venous insufficiency, since his swelling is limited to his feet and not in his ankles or lower legs.  Potentially this is due to his underlying chronic kidney disease.  I have encouraged him to continue leg elevation.  I have also encouraged him to wear compression stockings to help with the swelling.  He is also aware that his ABIs are stable and does not require vascular intervention. He can follow-up with our office in 1 year with repeat ABIs   Richard Holster PA-C [facility] Office: [phone]  Clinic MD: Sheree

## 2023-12-14 ENCOUNTER — Encounter: Payer: Self-pay | Admitting: Podiatry

## 2023-12-14 ENCOUNTER — Ambulatory Visit: Admitting: Podiatry

## 2023-12-14 DIAGNOSIS — E1142 Type 2 diabetes mellitus with diabetic polyneuropathy: Secondary | ICD-10-CM

## 2023-12-14 DIAGNOSIS — Q828 Other specified congenital malformations of skin: Secondary | ICD-10-CM | POA: Diagnosis not present

## 2023-12-14 DIAGNOSIS — M79674 Pain in right toe(s): Secondary | ICD-10-CM

## 2023-12-14 DIAGNOSIS — M79675 Pain in left toe(s): Secondary | ICD-10-CM | POA: Diagnosis not present

## 2023-12-14 DIAGNOSIS — I739 Peripheral vascular disease, unspecified: Secondary | ICD-10-CM | POA: Diagnosis not present

## 2023-12-14 DIAGNOSIS — B351 Tinea unguium: Secondary | ICD-10-CM

## 2023-12-14 NOTE — Progress Notes (Signed)
 Subjective:  Patient ID: Richard Shaw, male    DOB: 03-24-1950,  MRN: [id number]  Richard Shaw presents to clinic today for at risk footcare. Patient has h/o diabetes, neuropathy and PAD and is seen for  and painful porokeratotic lesion(s) b/l feet and painful mycotic toenails that limit ambulation. Painful toenails interfere with ambulation. Aggravating factors include wearing enclosed shoe gear. Pain is relieved with periodic professional debridement. Painful porokeratotic lesions are aggravated when weightbearing with and without shoegear. Pain is relieved with periodic professional debridement.  Chief Complaint  Patient presents with   [facility]    Rm2 Diabetic foot care / Dr. Charlott Last visit August Shaw   New problem(s): None.   PCP is Richard Shaw LABOR, MD.  Allergies  Allergen Reactions   Aspirin Other (See Comments)    Other reaction(s): stomach burns Pt can not take large quantities of aspirin    Iodinated Contrast Media     Other Reaction(s): hives/itching/ ok if takes Benadryl    Omnipaque [Iohexol] Swelling    Pt states at [facility] in [place] on 05/09/2016 he swelled all over after receiving 75mL of Omnipaque 350 concentration.    Penicillamine     Other reaction(s): hallucination Other reaction(s): hallucination   Penicillins Other (See Comments)    hallucinations Other reaction(s): Other hallucinations    Review of Systems: Negative except as noted in the HPI.  Objective: No changes noted in today's physical examination. There were no vitals filed for this visit. Richard Shaw is a pleasant 73 y.o. male WD, WN in NAD. AAO x 3.  Vascular Examination: CFT <3 seconds b/l LE. Palpable DP pulse(s) right lower extremity Palpable PT pulse(s) right lower extremity Diminished DP pulse(s) left lower extremity. Diminished PT pulse(s) left lower extremity. Pedal hair absent. Lower extremity skin temperature gradient within normal limits. No edema noted b/l LE. No  ischemia or gangrene noted b/l LE. No cyanosis or clubbing noted b/l LE.  Neurological Examination: Pt has subjective symptoms of neuropathy. Protective sensation intact 5/5 intact bilaterally with 10g monofilament b/l.  Dermatological Examination: Pedal skin warm and supple b/l.  No open wounds b/l. No interdigital macerations. Toenails 1-5 b/l elongated, thickened, discolored with subungual debris. +Tenderness with dorsal palpation of nailplates. Porokeratotic lesion(s) noted submet head 1 right foot, submet head 3 left foot, and submet head 5 right foot.  Musculoskeletal Examination: Muscle strength 5/5 to all lower extremity muscle groups bilaterally. HAV with bunion deformity noted b/l LE. Hammertoe(s) 2-5 b/l.SABRA No pain, crepitus or joint limitation noted with ROM b/l LE.  Patient ambulates independently without assistive aids.  Assessment/Plan: 1. Pain due to onychomycosis of toenails of both feet   2. Porokeratosis   3. PAD (peripheral artery disease)   4. Diabetic peripheral neuropathy associated with type 2 diabetes mellitus [facility])   Consent given for treatment. Patient examined. All patient's and/or POA's questions/concerns addressed on today's visit. Toenails 1-5 b/l debrided in length and girth without incident. Porokeratotic lesion(s) submet head 1 right foot, submet head 3 left foot, and submet head 5 right foot pared and enucleated with sharp debridement without incident. Continue foot and shoe inspections daily. Monitor blood glucose per PCP/Endocrinologist's recommendations.Continue soft, supportive shoe gear daily. Report any pedal injuries to medical professional. Call office if there are any questions/concerns.  Return in about 9 weeks (around 02/15/2024).  Richard Shaw, DPM      [place] LOCATION: [address]  [place], [place] [address]                   Office [phone]   [facility] LOCATION: [address] [place], [place] [address] Office [phone]

## 2023-12-20 ENCOUNTER — Encounter: Payer: Self-pay | Admitting: Urology

## 2023-12-20 ENCOUNTER — Ambulatory Visit: Payer: Self-pay | Admitting: Urology

## 2023-12-20 VITALS — BP 148/71 | HR 89 | Ht 69.0 in | Wt 165.0 lb

## 2023-12-20 DIAGNOSIS — N2889 Other specified disorders of kidney and ureter: Secondary | ICD-10-CM | POA: Diagnosis not present

## 2023-12-20 NOTE — Progress Notes (Signed)
 12/20/2023 1:47 PM   Richard Shaw Feb 10, 1950 [id number]  Referring provider: Elliot Charm, MD [address] [place],  [place] [address]  Chief Complaint  Patient presents with   Results   Urologic history:  1. Renal mass MRI abdomen April 2024 with a 2.4 x 2.0 cm right upper pole renal mass.    HPI: Richard Shaw is a 73 y.o. male presents for renal mass follow-up  No complaints since last visit No flank, abdominal, pelvic pain Denies gross hematuria Follow-up MRI July 2025 showed a stable 2.2 x 2.2 cm right upper pole mass   PMH: Past Medical History:  Diagnosis Date   Chronic kidney disease    Diabetes mellitus without complication (HCC)    Hepatitis C infection 1969   Herpes simplex    History of CVA (cerebrovascular accident) 07/2017   Hyperlipidemia    Neck pain    Tension headache     Surgical History: Past Surgical History:  Procedure Laterality Date   None      Home Medications:  Allergies as of 12/20/2023       Reactions   Aspirin Other (See Comments)   Other reaction(s): stomach burns Pt can not take large quantities of aspirin   Iodinated Contrast Media    Other Reaction(s): hives/itching/ ok if takes Benadryl    Omnipaque [iohexol] Swelling   Pt states at [facility] in [place] on 05/09/2016 he swelled all over after receiving 75mL of Omnipaque 350 concentration.    Penicillamine    Other reaction(s): hallucination Other reaction(s): hallucination   Penicillins Other (See Comments)   hallucinations Other reaction(s): Other hallucinations        Medication List        Accurate as of December 20, 2023  1:47 PM. If you have any questions, ask your nurse or doctor.          Contour Next Test test strip Generic drug: glucose blood See admin instructions.   diphenhydrAMINE  50 MG tablet Commonly known as: BENADRYL  Take 1 tablet 1 hour prior to the scan   EPINEPHrine 0.3 mg/0.3 mL Soaj  injection Commonly known as: EPI-PEN Inject 0.3 mg into the muscle as needed.   famciclovir 500 MG tablet Commonly known as: FAMVIR Take by mouth.   hydrOXYzine 25 MG tablet Commonly known as: ATARAX 1 tablet as needed   Jardiance 25 MG Tabs tablet Generic drug: empagliflozin Take 25 mg by mouth daily.   loratadine 10 MG tablet Commonly known as: CLARITIN Take 10 mg by mouth daily.   metFORMIN 1000 MG tablet Commonly known as: GLUCOPHAGE Take 1 tablet by mouth 2 (two) times daily with a meal.   Microlet Lancets Misc See admin instructions.   Ozempic (0.25 or 0.5 MG/DOSE) 2 MG/3ML Sopn Generic drug: Semaglutide(0.25 or 0.5MG /DOS) Inject 0.5 MG Subcutaneous Once a week for 30 days   predniSONE  50 MG tablet Commonly known as: DELTASONE  Take 1 tablet by mouth 13 hours, 7 hours  and 1 hour prior to the scan   simvastatin 10 MG tablet Commonly known as: ZOCOR Take 1 tablet by mouth daily.   traMADol 50 MG tablet Commonly known as: ULTRAM Take 50 mg by mouth 3 (three) times daily.        Allergies:  Allergies  Allergen Reactions   Aspirin Other (See Comments)    Other reaction(s): stomach burns Pt can not take large quantities of aspirin    Iodinated Contrast Media     Other Reaction(s):  hives/itching/ ok if takes Benadryl    Omnipaque [Iohexol] Swelling    Pt states at [facility] in [place] on 05/09/2016 he swelled all over after receiving 75mL of Omnipaque 350 concentration.    Penicillamine     Other reaction(s): hallucination Other reaction(s): hallucination   Penicillins Other (See Comments)    hallucinations Other reaction(s): Other hallucinations    Family History: Family History  Problem Relation Age of Onset   Breast cancer Mother    Stomach cancer Father    Heart disease Father     Social History:  reports that he has quit smoking. His smoking use included cigarettes. He has been exposed to tobacco smoke. He has quit using smokeless  tobacco. He reports that he does not currently use alcohol. He reports that he does not currently use drugs.   Physical Exam: BP (!) 148/71   Pulse 89   Ht 5' 9 (1.753 m)   Wt 165 lb (74.8 kg)   SpO2 95%   BMI 24.37 kg/m   Constitutional:  Alert, No acute distress. HEENT: [place] AT Respiratory: Normal respiratory effort, no increased work of breathing.  Psychiatric: Normal mood and affect.    Pertinent Imaging: MRI images were personally reviewed and interpreted.  On my review the mass measured 2.2 x 2.2 in January 2025 and 2.3 x 2.03 August 2023  Assessment & Plan:    1.  Right renal mass Minimal enlargement < 5 mm Options were again reviewed including renal mass biopsy, percutaneous ablation and surveillance He would like to continue surveillance and will schedule follow-up MRI in 1 year with office visit   Richard JAYSON Barba, MD  [facility] [address] [place], [place] [address] [phone]

## 2024-02-22 ENCOUNTER — Ambulatory Visit: Admitting: Podiatry

## 2024-02-22 DIAGNOSIS — Z91198 Patient's noncompliance with other medical treatment and regimen for other reason: Secondary | ICD-10-CM

## 2024-02-22 NOTE — Progress Notes (Signed)
 1. Failure to attend appointment with reason given    Patient rescheduled appointment.

## 2024-02-28 ENCOUNTER — Ambulatory Visit (INDEPENDENT_AMBULATORY_CARE_PROVIDER_SITE_OTHER): Admitting: Podiatry

## 2024-02-28 ENCOUNTER — Ambulatory Visit: Admitting: Podiatry

## 2024-02-28 DIAGNOSIS — B351 Tinea unguium: Secondary | ICD-10-CM

## 2024-02-28 DIAGNOSIS — M79674 Pain in right toe(s): Secondary | ICD-10-CM | POA: Diagnosis not present

## 2024-02-28 DIAGNOSIS — I739 Peripheral vascular disease, unspecified: Secondary | ICD-10-CM | POA: Diagnosis not present

## 2024-02-28 DIAGNOSIS — M79675 Pain in left toe(s): Secondary | ICD-10-CM

## 2024-02-28 DIAGNOSIS — Q828 Other specified congenital malformations of skin: Secondary | ICD-10-CM

## 2024-02-28 DIAGNOSIS — E1142 Type 2 diabetes mellitus with diabetic polyneuropathy: Secondary | ICD-10-CM

## 2024-02-28 NOTE — Progress Notes (Signed)
 "  Subjective:  Patient ID: Richard Shaw, male    DOB: [birth year],  MRN: [id number]  Richard Shaw presents to clinic today for at risk footcare. Patient has h/o diabetes, neuropathy and PAD and is seen for  and painful porokeratotic lesion(s) b/l feet and painful mycotic toenails that limit ambulation. Painful toenails interfere with ambulation. Aggravating factors include wearing enclosed shoe gear. Pain is relieved with periodic professional debridement. Painful porokeratotic lesions are aggravated when weightbearing with and without shoegear. Pain is relieved with periodic professional debridement.  Chief Complaint  Patient presents with   Diabetes    DFC NIDDM A1C 6.5. Toenail trim. LOV with PCP 09/21/23.   New problem(s): None.   PCP is Charlott Dorn LABOR, MD.  Allergies[1]  Review of Systems: Negative except as noted in the HPI.  Objective: No changes noted in today's physical examination. There were no vitals filed for this visit. Richard Shaw is a pleasant 74 y.o. male WD, WN in NAD. AAO x 3.  Vascular Examination: CFT <3 seconds b/l LE. Palpable DP pulse(s) right lower extremity Palpable PT pulse(s) right lower extremity Diminished DP pulse(s) left lower extremity. Diminished PT pulse(s) left lower extremity. Pedal hair absent. Lower extremity skin temperature gradient within normal limits. No edema noted b/l LE. No ischemia or gangrene noted b/l LE. No cyanosis or clubbing noted b/l LE.  Neurological Examination: Pt has subjective symptoms of neuropathy. Protective sensation intact 5/5 intact bilaterally with 10g monofilament b/l.  Dermatological Examination: Pedal skin warm and supple b/l.  No open wounds b/l. No interdigital macerations. Toenails 1-5 b/l elongated, thickened, discolored with subungual debris. +Tenderness with dorsal palpation of nailplates.   Porokeratotic lesion(s) noted submet head 1 right foot, submet head 3 left foot, and submet head 5 right  foot.  Musculoskeletal Examination: Muscle strength 5/5 to all lower extremity muscle groups bilaterally. HAV with bunion deformity noted b/l LE. Hammertoe(s) 2-5 b/l.SABRA No pain, crepitus or joint limitation noted with ROM b/l LE.  Patient ambulates independently without assistive aids.  Assessment/Plan: 1. Pain due to onychomycosis of toenails of both feet   2. Porokeratosis   3. PAD (peripheral artery disease)   4. Diabetic peripheral neuropathy associated with type 2 diabetes mellitus (HCC)     Patient was evaluated and treated. All patient's and/or POA's questions/concerns addressed on today's visit. Mycotic toenails 1-5 b/l debrided in length and girth without incident. Porokeratotic lesion(s) submet head 1 right foot, submet head 3 left foot, and submet head 5 right foot pared with sharp debridement without incident. Continue daily foot inspections and monitor blood glucose per PCP/Endocrinologist's recommendations. Continue soft, supportive shoe gear daily. Report any pedal injuries to medical professional. Call office if there are any questions/concerns.  Return in about 9 weeks (around 05/01/2024).  Richard Shaw, DPM      [place] LOCATION: [address] [place] [address]                   Office [phone]   [place] LOCATION: [address] [place], [place] [address] Office [phone]      [1]  Allergies Allergen Reactions   Aspirin Other (See Comments)    Other reaction(s): stomach burns  Pt can not take large quantities of aspirin    Iodinated Contrast Media     Other Reaction(s): hives/itching/ ok if takes Benadryl    Omnipaque [Iohexol] Swelling    Pt states at [facility] in [place] on 05/09/2016 he swelled all over after receiving 75mL of Omnipaque 350 concentration.    Penicillamine     Other reaction(s): hallucination Other reaction(s): hallucination   Penicillins Other (See  Comments)    hallucinations Other reaction(s): Other hallucinations   "

## 2024-03-02 ENCOUNTER — Encounter: Payer: Self-pay | Admitting: Podiatry

## 2024-03-26 ENCOUNTER — Ambulatory Visit: Admitting: Podiatry

## 2024-04-25 ENCOUNTER — Ambulatory Visit: Admitting: Podiatry

## 2024-04-29 ENCOUNTER — Ambulatory Visit: Admitting: Podiatry

## 2024-07-01 ENCOUNTER — Ambulatory Visit: Admitting: Podiatry

## 2024-11-04 ENCOUNTER — Other Ambulatory Visit

## 2024-12-18 ENCOUNTER — Ambulatory Visit: Admitting: Urology
